# Patient Record
Sex: Male | Born: 1940 | Race: Black or African American | Hispanic: No | Marital: Single | Smoking: Never smoker
Health system: Southern US, Community
[De-identification: ages and names within clinical notes are randomized; demographics above are authoritative.]

## PROBLEM LIST (undated history)

## (undated) DIAGNOSIS — I6529 Occlusion and stenosis of unspecified carotid artery: Secondary | ICD-10-CM

## (undated) DIAGNOSIS — N6489 Other specified disorders of breast: Secondary | ICD-10-CM

## (undated) DIAGNOSIS — Z7289 Other problems related to lifestyle: Secondary | ICD-10-CM

## (undated) DIAGNOSIS — E291 Testicular hypofunction: Secondary | ICD-10-CM

## (undated) DIAGNOSIS — E7800 Pure hypercholesterolemia, unspecified: Secondary | ICD-10-CM

## (undated) DIAGNOSIS — I10 Essential (primary) hypertension: Secondary | ICD-10-CM

## (undated) DIAGNOSIS — N62 Hypertrophy of breast: Secondary | ICD-10-CM

## (undated) DIAGNOSIS — E119 Type 2 diabetes mellitus without complications: Secondary | ICD-10-CM

## (undated) HISTORY — DX: Other problems related to lifestyle: Z72.89

## (undated) HISTORY — DX: Pure hypercholesterolemia, unspecified: E78.00

## (undated) HISTORY — DX: Type 2 diabetes mellitus without complications: E11.9

## (undated) HISTORY — DX: Testicular hypofunction: E29.1

## (undated) HISTORY — DX: Essential (primary) hypertension: I10

## (undated) HISTORY — PX: PROSTATE SURGERY: SHX751

## (undated) HISTORY — DX: Occlusion and stenosis of unspecified carotid artery: I65.29

## (undated) HISTORY — DX: Hypertrophy of breast: N62

## (undated) HISTORY — DX: Other specified disorders of breast: N64.89

---

## 2006-05-18 ENCOUNTER — Emergency Department: Payer: Self-pay | Admitting: Unknown Physician Specialty

## 2006-05-18 ENCOUNTER — Other Ambulatory Visit: Payer: Self-pay

## 2007-03-09 DIAGNOSIS — E7800 Pure hypercholesterolemia, unspecified: Secondary | ICD-10-CM

## 2007-03-09 DIAGNOSIS — K219 Gastro-esophageal reflux disease without esophagitis: Secondary | ICD-10-CM | POA: Insufficient documentation

## 2007-03-09 DIAGNOSIS — I10 Essential (primary) hypertension: Secondary | ICD-10-CM

## 2007-03-09 HISTORY — DX: Essential (primary) hypertension: I10

## 2007-03-09 HISTORY — DX: Pure hypercholesterolemia, unspecified: E78.00

## 2007-11-30 DIAGNOSIS — Z7289 Other problems related to lifestyle: Secondary | ICD-10-CM

## 2007-11-30 DIAGNOSIS — Z789 Other specified health status: Secondary | ICD-10-CM

## 2007-11-30 HISTORY — DX: Other specified health status: Z78.9

## 2007-11-30 HISTORY — DX: Other problems related to lifestyle: Z72.89

## 2008-08-31 DIAGNOSIS — E119 Type 2 diabetes mellitus without complications: Secondary | ICD-10-CM

## 2008-08-31 HISTORY — DX: Type 2 diabetes mellitus without complications: E11.9

## 2008-09-01 ENCOUNTER — Ambulatory Visit: Payer: Self-pay | Admitting: Family Medicine

## 2008-09-15 DIAGNOSIS — I6529 Occlusion and stenosis of unspecified carotid artery: Secondary | ICD-10-CM

## 2008-09-15 HISTORY — DX: Occlusion and stenosis of unspecified carotid artery: I65.29

## 2008-10-03 ENCOUNTER — Ambulatory Visit: Payer: Self-pay | Admitting: Vascular Surgery

## 2008-12-28 ENCOUNTER — Emergency Department: Payer: Self-pay | Admitting: Emergency Medicine

## 2009-02-08 ENCOUNTER — Ambulatory Visit: Payer: Self-pay | Admitting: Family Medicine

## 2011-03-27 ENCOUNTER — Ambulatory Visit: Payer: Self-pay | Admitting: Family Medicine

## 2011-09-22 ENCOUNTER — Emergency Department: Payer: Self-pay | Admitting: Emergency Medicine

## 2011-09-22 LAB — BASIC METABOLIC PANEL
Anion Gap: 5 — ABNORMAL LOW (ref 7–16)
Calcium, Total: 9.2 mg/dL (ref 8.5–10.1)
Chloride: 107 mmol/L (ref 98–107)
Co2: 29 mmol/L (ref 21–32)
Creatinine: 1.41 mg/dL — ABNORMAL HIGH (ref 0.60–1.30)

## 2011-09-22 LAB — URIC ACID: Uric Acid: 6.8 mg/dL (ref 3.5–7.2)

## 2012-01-10 ENCOUNTER — Emergency Department: Payer: Self-pay | Admitting: Emergency Medicine

## 2012-01-13 ENCOUNTER — Emergency Department: Payer: Self-pay | Admitting: Emergency Medicine

## 2012-11-04 ENCOUNTER — Observation Stay: Payer: Self-pay | Admitting: Internal Medicine

## 2012-11-04 DIAGNOSIS — K922 Gastrointestinal hemorrhage, unspecified: Secondary | ICD-10-CM | POA: Insufficient documentation

## 2012-11-04 LAB — COMPREHENSIVE METABOLIC PANEL
Anion Gap: 4 — ABNORMAL LOW (ref 7–16)
BUN: 26 mg/dL — ABNORMAL HIGH (ref 7–18)
Calcium, Total: 9 mg/dL (ref 8.5–10.1)
Co2: 26 mmol/L (ref 21–32)
EGFR (Non-African Amer.): 46 — ABNORMAL LOW
Glucose: 111 mg/dL — ABNORMAL HIGH (ref 65–99)
Osmolality: 283 (ref 275–301)
Potassium: 4.2 mmol/L (ref 3.5–5.1)
SGOT(AST): 25 U/L (ref 15–37)
SGPT (ALT): 31 U/L (ref 12–78)
Sodium: 139 mmol/L (ref 136–145)
Total Protein: 6.7 g/dL (ref 6.4–8.2)

## 2012-11-04 LAB — CK TOTAL AND CKMB (NOT AT ARMC)
CK, Total: 139 U/L (ref 35–232)
CK, Total: 117 U/L (ref 35–232)
CK-MB: 1.9 ng/mL (ref 0.5–3.6)
CK-MB: 1.7 ng/mL (ref 0.5–3.6)

## 2012-11-04 LAB — URINALYSIS, COMPLETE
Bacteria: NONE SEEN
Blood: NEGATIVE
Glucose,UR: NEGATIVE mg/dL (ref 0–75)
Leukocyte Esterase: NEGATIVE
Nitrite: NEGATIVE
Ph: 7 (ref 4.5–8.0)
RBC,UR: <1 /HPF (ref 0–5)
Specific Gravity: 1.012 (ref 1.003–1.030)

## 2012-11-04 LAB — CBC
HGB: 12.3 g/dL — ABNORMAL LOW (ref 13.0–18.0)
MCH: 25.6 pg — ABNORMAL LOW (ref 26.0–34.0)
Platelet: 166 10*3/uL (ref 150–440)
RDW: 15.2 % — ABNORMAL HIGH (ref 11.5–14.5)
WBC: 5.4 10*3/uL (ref 3.8–10.6)

## 2012-11-04 LAB — TROPONIN I
Troponin-I: <0.02 ng/mL
Troponin-I: <0.02 ng/mL

## 2012-11-05 LAB — CBC WITH DIFFERENTIAL/PLATELET
Basophil #: 0.1 10*3/uL (ref 0.0–0.1)
Eosinophil #: 0.1 10*3/uL (ref 0.0–0.7)
Lymphocyte %: 20.6 %
MCH: 25.5 pg — ABNORMAL LOW (ref 26.0–34.0)
Neutrophil #: 5 10*3/uL (ref 1.4–6.5)
Neutrophil %: 69.7 %
RBC: 4.27 10*6/uL — ABNORMAL LOW (ref 4.40–5.90)
RDW: 15.5 % — ABNORMAL HIGH (ref 11.5–14.5)

## 2012-11-05 LAB — BASIC METABOLIC PANEL
Anion Gap: 3 — ABNORMAL LOW (ref 7–16)
BUN: 28 mg/dL — ABNORMAL HIGH (ref 7–18)
Calcium, Total: 9.1 mg/dL (ref 8.5–10.1)
Chloride: 111 mmol/L — ABNORMAL HIGH (ref 98–107)
Creatinine: 1.51 mg/dL — ABNORMAL HIGH (ref 0.60–1.30)
EGFR (African American): 52 — ABNORMAL LOW
EGFR (Non-African Amer.): 45 — ABNORMAL LOW
Potassium: 3.8 mmol/L (ref 3.5–5.1)

## 2012-11-05 LAB — MAGNESIUM: Magnesium: 2 mg/dL

## 2012-11-05 LAB — LIPID PANEL
Cholesterol: 150 mg/dL (ref 0–200)
HDL Cholesterol: 36 mg/dL — ABNORMAL LOW (ref 40–60)
Ldl Cholesterol, Calc: 85 mg/dL (ref 0–100)
Triglycerides: 145 mg/dL (ref 0–200)
VLDL Cholesterol, Calc: 29 mg/dL (ref 5–40)

## 2012-11-06 ENCOUNTER — Inpatient Hospital Stay: Payer: Self-pay | Admitting: Internal Medicine

## 2012-11-06 LAB — COMPREHENSIVE METABOLIC PANEL
Alkaline Phosphatase: 58 U/L (ref 50–136)
BUN: 33 mg/dL — ABNORMAL HIGH (ref 7–18)
Bilirubin,Total: 0.2 mg/dL (ref 0.2–1.0)
Calcium, Total: 8.9 mg/dL (ref 8.5–10.1)
Creatinine: 1.87 mg/dL — ABNORMAL HIGH (ref 0.60–1.30)
EGFR (African American): 40 — ABNORMAL LOW
EGFR (Non-African Amer.): 35 — ABNORMAL LOW
SGOT(AST): 21 U/L (ref 15–37)
Sodium: 142 mmol/L (ref 136–145)
Total Protein: 6.1 g/dL — ABNORMAL LOW (ref 6.4–8.2)

## 2012-11-06 LAB — URINALYSIS, COMPLETE
Leukocyte Esterase: NEGATIVE
Nitrite: NEGATIVE
RBC,UR: <1 /HPF (ref 0–5)
Specific Gravity: 1.013 (ref 1.003–1.030)
Squamous Epithelial: <1

## 2012-11-06 LAB — APTT: Activated PTT: <23 secs — ABNORMAL LOW (ref 23.6–35.9)

## 2012-11-06 LAB — CBC
HCT: 30 % — ABNORMAL LOW (ref 40.0–52.0)
HCT: 26.7 % — ABNORMAL LOW (ref 40.0–52.0)
HGB: 9.6 g/dL — ABNORMAL LOW (ref 13.0–18.0)
MCHC: 32.7 g/dL (ref 32.0–36.0)
MCV: 79 fL — ABNORMAL LOW (ref 80–100)
Platelet: 145 10*3/uL — ABNORMAL LOW (ref 150–440)
RBC: 3.79 10*6/uL — ABNORMAL LOW (ref 4.40–5.90)
RBC: 3.37 10*6/uL — ABNORMAL LOW (ref 4.40–5.90)
RDW: 15 % — ABNORMAL HIGH (ref 11.5–14.5)
RDW: 15.1 % — ABNORMAL HIGH (ref 11.5–14.5)
WBC: 8.4 10*3/uL (ref 3.8–10.6)

## 2012-11-07 LAB — BASIC METABOLIC PANEL
Anion Gap: 2 — ABNORMAL LOW (ref 7–16)
Calcium, Total: 8.3 mg/dL — ABNORMAL LOW (ref 8.5–10.1)
Co2: 26 mmol/L (ref 21–32)
EGFR (African American): 45 — ABNORMAL LOW
Glucose: 108 mg/dL — ABNORMAL HIGH (ref 65–99)
Osmolality: 287 (ref 275–301)

## 2012-11-07 LAB — CBC WITH DIFFERENTIAL/PLATELET
Basophil %: 0.6 %
Eosinophil %: 1.4 %
HCT: 23 % — ABNORMAL LOW (ref 40.0–52.0)
Lymphocyte #: 1.4 10*3/uL (ref 1.0–3.6)
MCHC: 33.3 g/dL (ref 32.0–36.0)
Monocyte #: 0.6 x10 3/mm (ref 0.2–1.0)
Neutrophil #: 5.2 10*3/uL (ref 1.4–6.5)
Platelet: 147 10*3/uL — ABNORMAL LOW (ref 150–440)
RDW: 15 % — ABNORMAL HIGH (ref 11.5–14.5)

## 2012-11-07 LAB — HEMOGLOBIN: HGB: 7.8 g/dL — ABNORMAL LOW (ref 13.0–18.0)

## 2012-11-08 LAB — CBC WITH DIFFERENTIAL/PLATELET
Basophil #: 0 10*3/uL (ref 0.0–0.1)
Basophil %: 0.6 %
Eosinophil #: 0.2 10*3/uL (ref 0.0–0.7)
Eosinophil %: 2.5 %
HGB: 8.6 g/dL — ABNORMAL LOW (ref 13.0–18.0)
Lymphocyte #: 1.1 10*3/uL (ref 1.0–3.6)
Monocyte #: 0.4 x10 3/mm (ref 0.2–1.0)
Monocyte %: 6.5 %
RBC: 3.17 10*6/uL — ABNORMAL LOW (ref 4.40–5.90)
RDW: 14.9 % — ABNORMAL HIGH (ref 11.5–14.5)
WBC: 6.7 10*3/uL (ref 3.8–10.6)

## 2012-11-08 LAB — BASIC METABOLIC PANEL
Anion Gap: 3 — ABNORMAL LOW (ref 7–16)
BUN: 17 mg/dL (ref 7–18)
Calcium, Total: 8.3 mg/dL — ABNORMAL LOW (ref 8.5–10.1)
Creatinine: 1.46 mg/dL — ABNORMAL HIGH (ref 0.60–1.30)
EGFR (African American): 54 — ABNORMAL LOW
EGFR (Non-African Amer.): 47 — ABNORMAL LOW
Glucose: 98 mg/dL (ref 65–99)
Osmolality: 281 (ref 275–301)
Sodium: 140 mmol/L (ref 136–145)

## 2012-11-09 LAB — HEMOGLOBIN: HGB: 8.8 g/dL — ABNORMAL LOW (ref 13.0–18.0)

## 2012-11-10 LAB — HEMOGLOBIN: HGB: 9 g/dL — ABNORMAL LOW (ref 13.0–18.0)

## 2012-11-10 LAB — CREATININE, SERUM: Creatinine: 1.44 mg/dL — ABNORMAL HIGH (ref 0.60–1.30)

## 2013-04-20 ENCOUNTER — Other Ambulatory Visit: Payer: Self-pay | Admitting: Family Medicine

## 2013-04-20 LAB — BASIC METABOLIC PANEL
BUN: 12 mg/dL (ref 4–21)
Creatinine: 1.3 mg/dL (ref <=1.3)
Glucose: 99 mg/dL
Potassium: 3.9 mmol/L (ref 3.4–5.3)
SODIUM: 139 mmol/L (ref 137–147)

## 2013-04-20 LAB — LIPID PANEL
Cholesterol: 164 mg/dL (ref 0–200)
HDL: 39 mg/dL (ref 35–70)
LDL Cholesterol: 107 mg/dL
Triglycerides: 90 mg/dL (ref 40–160)

## 2013-04-21 ENCOUNTER — Other Ambulatory Visit: Payer: Self-pay | Admitting: Family Medicine

## 2013-04-21 LAB — LIPID PANEL
CHOLESTEROL: 164 mg/dL (ref 0–200)
HDL Cholesterol: 39 mg/dL — ABNORMAL LOW (ref 40–60)
Ldl Cholesterol, Calc: 107 mg/dL — ABNORMAL HIGH (ref 0–100)
Triglycerides: 90 mg/dL (ref 0–200)
VLDL CHOLESTEROL, CALC: 18 mg/dL (ref 5–40)

## 2013-04-21 LAB — RENAL FUNCTION PANEL
ALBUMIN: 3.4 g/dL (ref 3.4–5.0)
ANION GAP: 4 — AB (ref 7–16)
BUN: 12 mg/dL (ref 7–18)
CHLORIDE: 106 mmol/L (ref 98–107)
CREATININE: 1.3 mg/dL (ref 0.60–1.30)
Calcium, Total: 8.8 mg/dL (ref 8.5–10.1)
Co2: 29 mmol/L (ref 21–32)
EGFR (African American): >60
EGFR (Non-African Amer.): 53 — ABNORMAL LOW
GLUCOSE: 99 mg/dL (ref 65–99)
Osmolality: 277 (ref 275–301)
POTASSIUM: 3.9 mmol/L (ref 3.5–5.1)
Phosphorus: 3.2 mg/dL (ref 2.5–4.9)
SODIUM: 139 mmol/L (ref 136–145)

## 2013-04-21 LAB — HEMOGLOBIN A1C: Hemoglobin A1C: 6.7 % — ABNORMAL HIGH (ref 4.2–6.3)

## 2013-09-11 IMAGING — US US RENAL KIDNEY
1 series · 17 of 25 positions shown · non-contrast
Comparison: None

REASON FOR EXAM: blood in urine and renal insufficiency
COMMENTS:

PROCEDURE:     US  - US KIDNEY  - March 27, 2011  [DATE]
RESULT:     Indication: Blood in urine

[Series 1: us renal kidney · 17 of 47 slices shown]
[im 1/47]
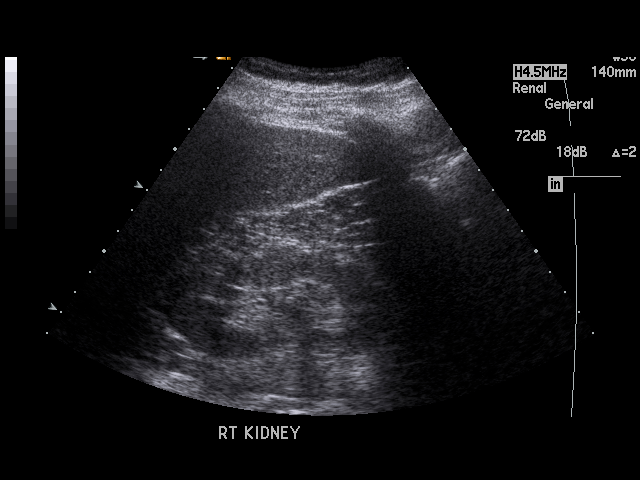
[im 4/47]
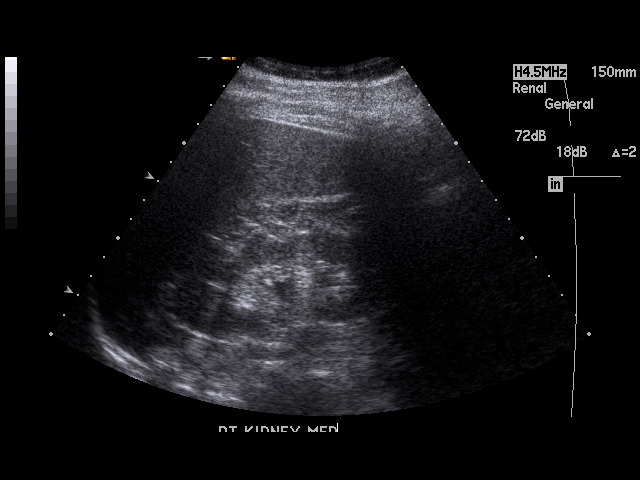
[im 6/47]
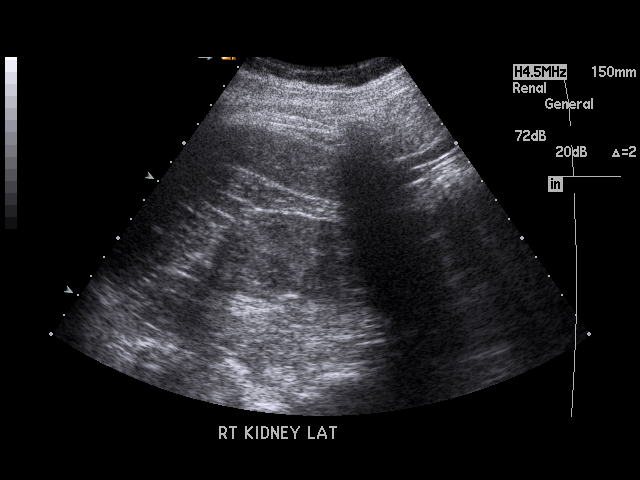
[im 10/47]
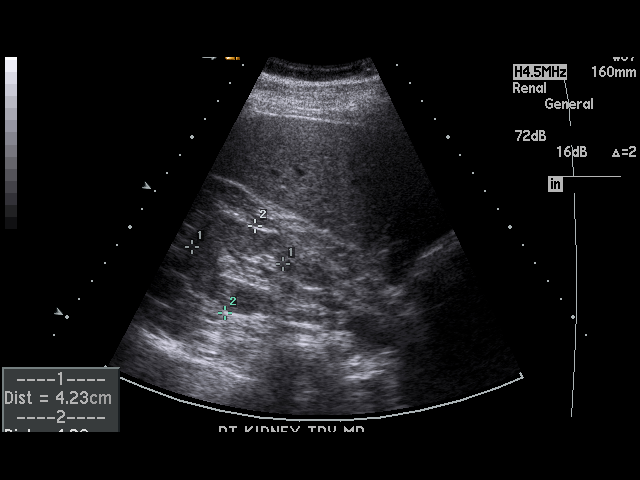
[im 12/47]
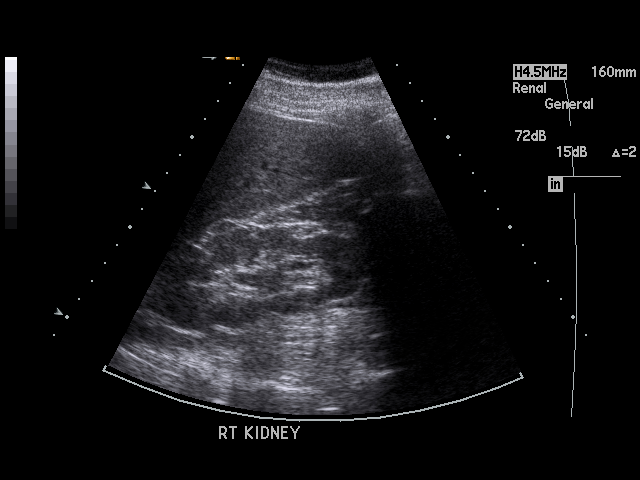
[im 16/47]
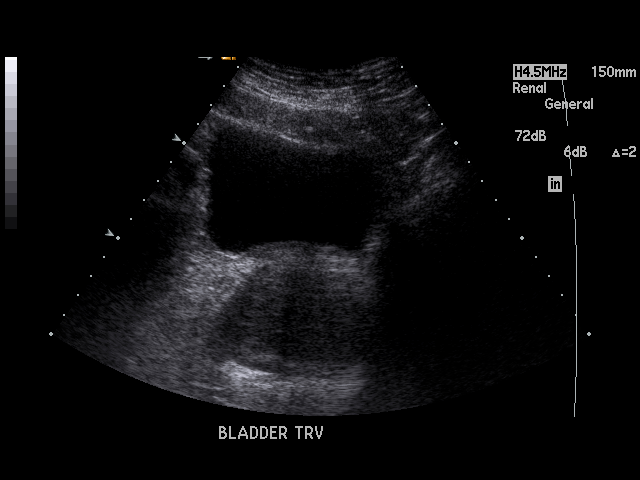
[im 18/47]
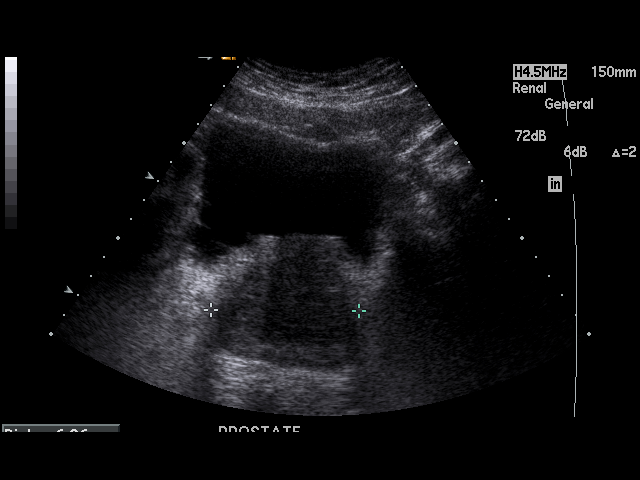
[im 22/47]
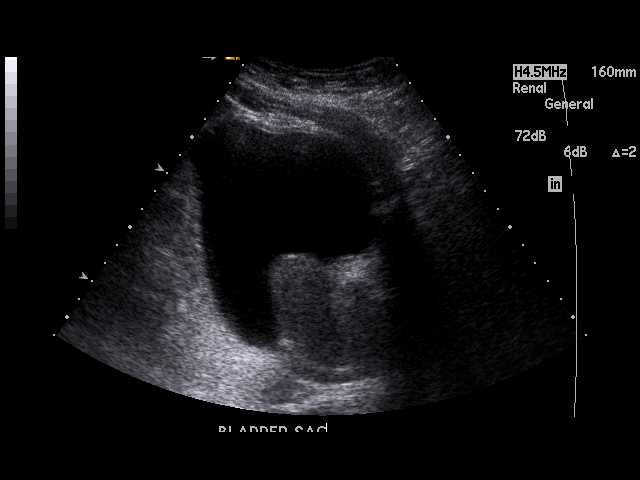
[im 24/47]
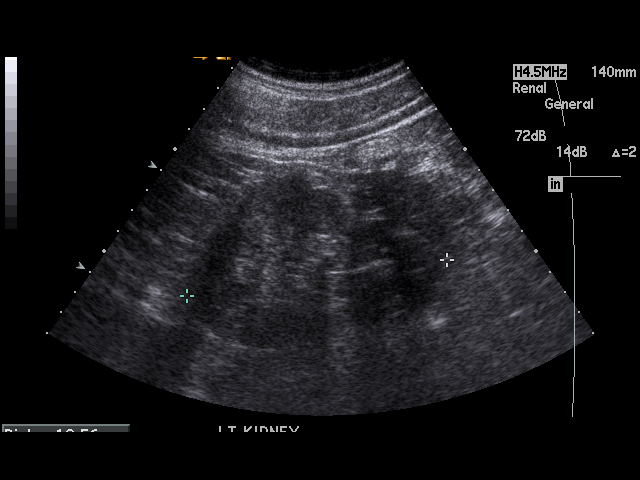
[im 25/47]
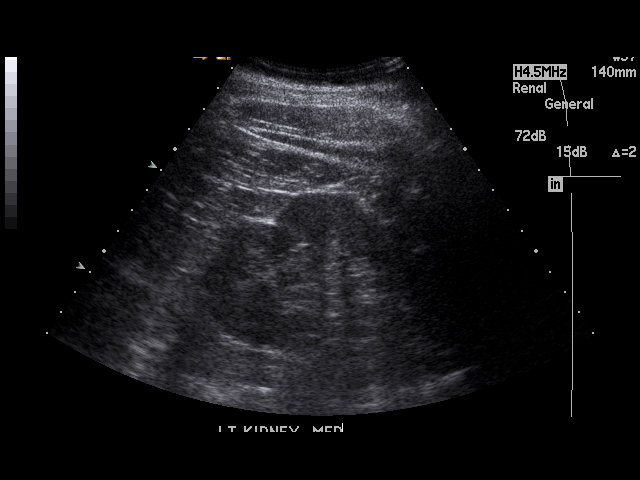
[im 29/47]
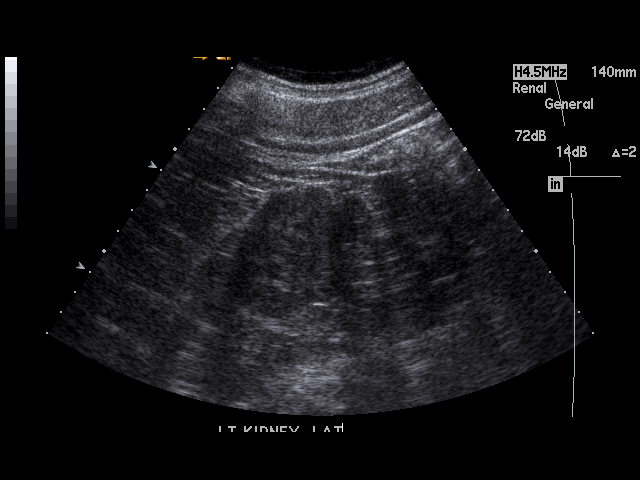
[im 31/47]
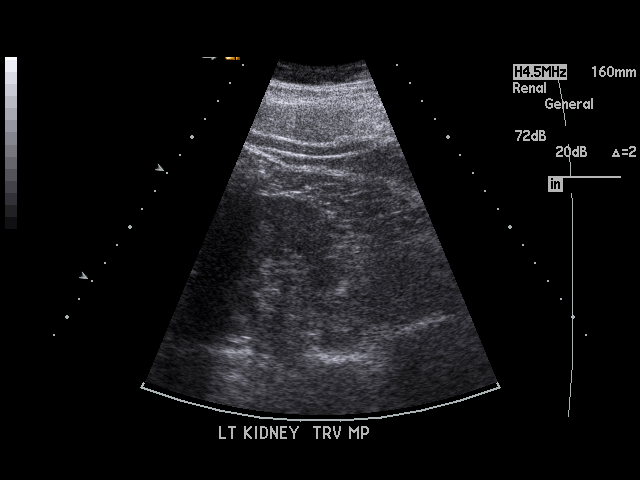
[im 35/47]
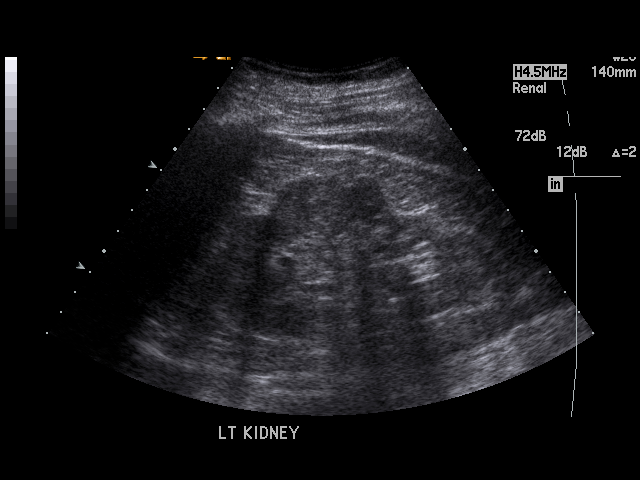
[im 37/47]
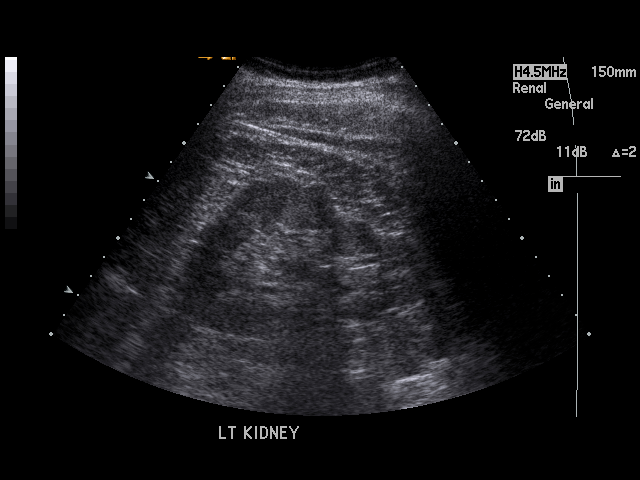
[im 41/47]
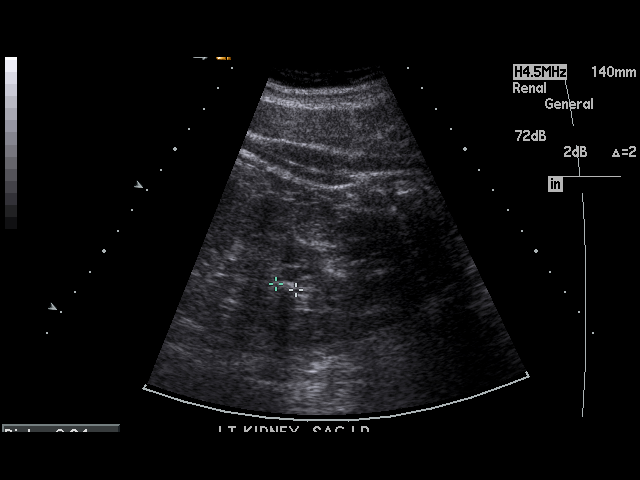
[im 43/47]
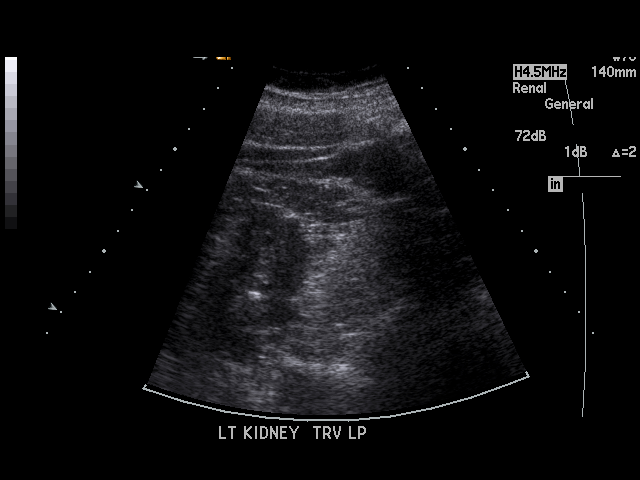
[im 47/47]
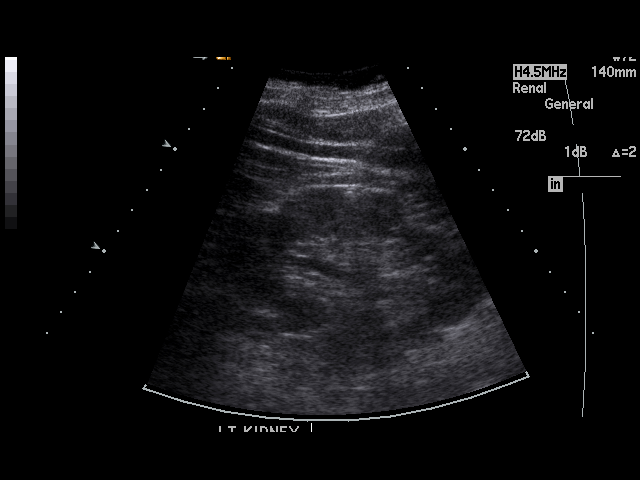

[17 of 25 positions shown; findings below may reference images not displayed]

Technique and findings: Multiple gray-scale and color doppler images of the
kidneys were obtained.

The right kidney measures 10.1 x 4.3 x 4.2 cm and the left kidney measures
10.6 x 5.2 x 5.2 cm. There is a nonobstructing left renal calculus. There is
no hydronephrosis.  There are no echogenic foci.  There are no renal masses.
There is no free fluid in the region of the renal fossa.
IMPRESSION: 1. Small nonobstructing left renal calculus.

2. Otherwise normal renal ultrasound.

## 2014-03-24 DIAGNOSIS — I6529 Occlusion and stenosis of unspecified carotid artery: Secondary | ICD-10-CM | POA: Diagnosis not present

## 2014-03-24 DIAGNOSIS — E1129 Type 2 diabetes mellitus with other diabetic kidney complication: Secondary | ICD-10-CM | POA: Diagnosis not present

## 2014-03-24 DIAGNOSIS — N181 Chronic kidney disease, stage 1: Secondary | ICD-10-CM | POA: Diagnosis not present

## 2014-03-24 LAB — HEMOGLOBIN A1C: HEMOGLOBIN A1C: 6.3 % — AB (ref 4.0–6.0)

## 2014-04-21 DIAGNOSIS — H4011X3 Primary open-angle glaucoma, severe stage: Secondary | ICD-10-CM | POA: Diagnosis not present

## 2014-04-28 NOTE — Consult Note (Signed)
I discussed transfusion with patient yesterday, will call daughter about this also.  Electronic Signatures: Scot JunElliott, Jovonni T (MD)  (Signed on 02-Nov-14 09:50)  Authored  Last Updated: 02-Nov-14 09:50 by Scot JunElliott, Terrion T (MD)

## 2014-04-28 NOTE — Consult Note (Signed)
PATIENT NAME:  Ernest Lewis, Berry C MR#:  [id number] DATE OF BIRTH:  1940/12/18  DATE OF CONSULTATION:  11/06/2012  REFERRING PHYSICIAN:   CONSULTING PHYSICIAN:  Scot Junobert T. Elliott, MD  HISTORY OF PRESENT ILLNESS:  The patient is a 74 year old black male who was originally admitted to this facility with presyncope, possible dehydration, vasovagal syncope, was discharged yesterday and he had another syncopal spell today and was found on the bathroom floor and was brought to the hospital where he was noted to have worsening anemia and heme-positive melanotic stools.  I was asked to see him in consultation after he was admitted.   PAST MEDICAL HISTORY:  Type 2 diabetes, BPH, mild chronic renal insufficiency, benign hypertension.   MEDICATIONS:  Flomax 0.4 mg daily, Norvasc 10 mg a day, pravastatin 10 mg a day, aspirin 81 mg a day.   ALLERGIES:  No known drug allergies.   SOCIAL HISTORY:  Does not smoke, does not drink.  Daughter is present for the history.   FAMILY HISTORY:  Hypertension.   REVIEW OF SYSTEMS:   No blurred vision.  No ringing in his ears.  No asthma or wheezing.  No chest pains.  No skipping irregular heartbeats.  He has noticed that his stools have become darker over the last week.  He did take Pepto-Bismol 2 times.  No dysuria.  No hematuria.  No history of stroke or seizure.   PHYSICAL EXAMINATION:  VITAL SIGNS:  The patient was attempted to do postural blood pressures in the ER and got lightheaded and weak with standing up.  HEENT:  Sclerae are nonicteric.  Conjunctivae negative.  Tongue negative.  NECK:  No thyromegaly.  No carotid bruits.  CHEST:  Clear.  HEART:  Regular rate and rhythm.  ABDOMEN:  No hepatosplenomegaly.  No masses.  No bruits.  RECTAL:  Exam done by ER physician was black liquid stool, which was heme positive.  EXTREMITIES:  No edema. NEUROLOGIC:  The patient pleasant, appropriate and fairly good historian.    He had been bothered mostly by his prostate  in the past.    LABORATORY DATA:  EKG shows no acute changes, BUN 33, creatinine 1.7, sodium 142, potassium 4.1, white count 7.6, hemoglobin 9.6, yesterday was 10.9, PT today 13.5, INR 1.  A CT of the head, changes of atrophy with chronic microvascular ischemic disease, stable appearance.  Cervical spine, severe degenerative changes in the cervical spine, some spinal canal narrowing and foraminal narrowing diffusely.  Ultrasound of his carotids done a couple of days ago showed atherosclerotic disease without definite evidence of hemodynamically significant stenosis.   ASSESSMENT:  Gastrointestinal bleed, possible ulcer, possible gastritis.  I doubt neoplasm.   PLAN:  Hydrate today.  Repeat hemoglobin tomorrow and do an upper endoscopy tomorrow.    ____________________________ Scot Junobert T. Elliott, MD rte:ea D: 11/06/2012 16:46:50 ET T: 11/06/2012 17:36:59 ET JOB#: [id number]  cc: Scot Junobert T. Elliott, MD, <Dictator> Demetrios Isaacsonald E. Sherrie MustacheFisher, MD Scot JunOBERT T ELLIOTT MD ELECTRONICALLY SIGNED 12/07/2012 15:02

## 2014-04-28 NOTE — Consult Note (Signed)
Pt CC duodenal ulcer with bleeding. EGD with treatment of ulcer, transfused blood as appropriate.  Today no postural signif changes, feels better, a bit of abd pain.  Is hungry.  Hgb 8.6, plt 131, creat 1.46, bun17.  Start clear liquid and advance to full as tolerated but not to regular food for 4-5 days.  Home Wednesday if no rebleed.  follow up with me in office in 2 weeks or so.  Electronic Signatures: Scot JunElliott, Gabriel T (MD)  (Signed on 03-Nov-14 15:37)  Authored  Last Updated: 03-Nov-14 15:37 by Scot JunElliott, Leopoldo T (MD)

## 2014-04-28 NOTE — Discharge Summary (Signed)
PATIENT NAME:  Ernest Lewis, Ernest Lewis MR#:  [id number] DATE OF BIRTH:  1940-03-08  DATE OF ADMISSION:  11/04/2012 DATE OF DISCHARGE:  11/05/2012  DISCHARGE DIAGNOSES: 1.  Presyncope secondary to vasovagal and dehydration.  2.  Benign prostatic hypertrophy.   IMAGING STUDIES: Include:  1.  Carotid Dopplers which showed no significant stenosis.  2.  Echocardiogram showed EF of 60% with no significant valvular abnormalities. No thrombus.  3.  CT scan of the head showed no acute abnormalities.   ADMITTING HISTORY AND PHYSICAL: Please see detailed H and P dictated by Dr. Amado CoeGouru. In brief, a 74 year old African American male patient with history of prostate problems and hyperlipidemia who presented to the hospital with complaints of presyncope. The patient felt like he was dehydrated, was in very hot environment for a day with decreased intake. Also had problems with urination and was straining prior to the episode. The patient was admitted for further work-up.   HOSPITAL COURSE: The patient was started on IV fluids. Does have CKD with baseline creatinine of 1.4, which is stable. The patient did have positive orthostatics initially which resolved with IV fluids. The patient felt better by the day of discharge, had normal carotids on echo and CT scan of the head. Was started on Flomax for his straining. He likely had a vasovagal presyncopal episode secondary to straining which caused symptoms.   Prior to discharge, the patient felt well, ambulated on his own. Cardiac examination showed S1 and S2 without any murmurs.   DISCHARGE MEDICATIONS: Include:  1.  Flomax 0.4 mg oral daily.  2.  Amlodipine 10 mg daily.  3.  Pravastatin 10 mg daily.  4.  Aspirin 81 mg daily.   DISCHARGE INSTRUCTIONS: Low-salt diet. Activity as tolerated. Follow up with primary care physician in 1 to 2 weeks.   TIME SPENT: On day of discharge in discharge activity was greater than 30 minutes. ____________________________ Molinda BailiffSrikar R.  Marcena Dias, MD srs:sb D: 11/05/2012 15:44:06 ET T: 11/05/2012 16:32:35 ET JOB#: [id number]  cc: Wardell HeathSrikar R. Thaddius Manes, MD, <Dictator> Orie FishermanSRIKAR R Tateanna Bach MD ELECTRONICALLY SIGNED 11/15/2012 15:00

## 2014-04-28 NOTE — H&P (Signed)
PATIENT NAME:  Ernest RoupMORROW, Jaxten C MR#:  [id number] DATE OF BIRTH:  11/11/1940  DATE OF ADMISSION:  11/04/2012  PRIMARY CARE PHYSICIAN: Dr. Chilton SiGreen   CHIEF COMPLAINT: Near syncope.   HISTORY OF PRESENT ILLNESS: The patient is a 74 year old African American male with a past medical history of diabetes mellitus, hypertension and probably chronic renal insufficiency who is presenting to the ER with the chief complaint of dizziness associated with diaphoresis. The patient is reporting that at around 3 a.m. today he was feeling thirsty and went to the fridge to get a cup of water, felt dizzy, and almost passed out. The patient was in drenching sweats and had a bowel movement also. Denies any complete loss of consciousness. Denies any chest pain, shortness of breath, abdominal pain, nausea, vomiting, diarrhea. He admitted that as he has been constipated he is using stool softeners and also drinking a lot of prune juice regarding his constipation. He denies any jerking movements during that period of near syncope. He felt like he was dehydrated as he was getting too much heat from his heater. The patient was uncomfortable and drove to the ER. In the ER, CAT scan of the head is negative. First set of troponins are negative. EKG did not show any changes. The patient was slightly orthostatic. He is not taking any home medications for the past 1 year and not seen by any doctor for the past 1 year. The patient has history of diabetes mellitus, but he does not believe that he is diabetic. No other complaints. Resting comfortably during my examination. No family members at bedside.   PAST MEDICAL HISTORY: Diabetes mellitus, hypertension, probably chronic renal insufficiency.   PAST SURGICAL HISTORY: None.   ALLERGIES: No known drug allergies.   PSYCHOSOCIAL HISTORY: Lives at home with son. Denies any history of smoking. He used to drink but quit drinking. Denies any drugs.   FAMILY HISTORY: Dad has history of  hypertension. Mom was deceased when he was 89 years old.  REVIEW OF SYSTEMS: CONSTITUTIONAL: Denies any fever, fatigue or weakness.  EYES: Denies blurry vision, glaucoma.  ENT: Denies epistaxis, discharge.  RESPIRATORY: Denies cough, COPD. CARDIOVASCULAR: No chest pain, palpitations. Slightly dizzy.  GASTROINTESTINAL: Denies nausea, vomiting or diarrhea, but complaining of constipation and using stool softeners and prune juice.  GENITOURINARY: No dysuria or hematuria. Denies any hernias.  ENDOCRINE: Denies polyuria, nocturia. Denies any hypothyroidism.  HEMATOLOGIC AND LYMPHATIC: No anemia, easy bruising, bleeding.  INTEGUMENTARY: No acne, rash, lesions.  MUSCULOSKELETAL: No joint pain in the neck and back. Denies gout.  NEUROLOGIC: No vertigo or ataxia.  PSYCHIATRIC: Denies any ADD or OCD.  PHYSICAL EXAMINATION: VITAL SIGNS: Temperature is 98.4, pulse 75, respirations 18, blood pressure 153/80 and pulse ox 99% on room air.  GENERAL APPEARANCE: Not in acute distress. Moderately built and nourished.  HEENT: Normocephalic, atraumatic. Pupils are equally reacting to light and accommodation. No scleral icterus. No conjunctival injection. No sinus tenderness. No postnasal drip. Dry mucous membranes.  NECK: Supple. No JVD. No thyromegaly. Range of motion is intact.  LUNGS: Clear to auscultation bilaterally. No accessory muscle usage. Negative for tenderness on palpation.  CARDIAC: S1, S2 normal. Regular rate and rhythm. No murmurs.  ABDOMEN: Soft, obese. Bowel sounds are positive in all 4 quadrants. Nontender, nondistended. No hepatosplenomegaly. No masses. NEUROLOGIC: Awake, alert and oriented x 3. Cranial nerves II through XII are grossly intact. Motor and sensory is intact. Reflexes are 2+.  EXTREMITIES: No edema. No cyanosis. No clubbing.  VASCULAR: Dorsalis pedis and posterior tibialis are intact.  MUSCULOSKELETAL: No joint effusion, tenderness, erythema. PSYCH:  Normal mood and affect.   SKIN: Warm to touch. Normal turgor. No rashes. No lesions.  LABORATORY AND IMAGING STUDIES: A CT of the head is normal. Chest x-ray: No acute findings.   LFTs are normal. First set of cardiac enzymes are normal. WBC 5.4, hemoglobin 12.3, hematocrit 37.5, platelets 156. Urinalysis is normal. Glucose 111, BUN 26, creatinine 1.47 which was at 1.41 in September of 2013, sodium 139, potassium 4.2, chloride 109, CO2 26, anion gap 4, serum osmolality 283, GFR 54, calcium 9.0.   ASSESSMENT AND PLAN: A 74 year old African American male presenting to the ER with the chief complaint of near syncope. Will be admitted with the following assessment and plan:  1.  Near-syncope, probably from dehydration from too much heat from heater at home. Will admit him to med/surg unit with tele. Will obtain a 2-D echocardiogram and carotid Dopplers. CT head is negative. We will check orthostatics and neuro checks. We will provide him hydration with IV fluids. Cycle cardiac biomarkers.  2.  Acute kidney injury, probably on chronic kidney disease from dehydration. We will provide him gentle hydration with IV fluids.  3.  Diabetes mellitus. The patient will be on sliding scale insulin and will check his hemoglobin A1c. 4.  Hypertension. The patient stopped taking blood pressure medication. Will restart his amlodipine and also continue baby aspirin. We will check his fasting lipid panel.  5.  Noncompliance. Reinforced the importance of being compliant with the medication.  6.  Constipation. He is on stool softener and right now he denies any constipation. If necessary we will provide him stool softeners as needed.  7.  Will provide him gastrointestinal and deep vein thrombosis prophylaxis.   He is FULL CODE. Daughter is the medical power of attorney.   Diagnosis and plan of care was discussed in detail with the patient. He is aware of the plan. ____________________________ Ramonita Lab, MD ag:sb D: 11/04/2012 07:33:35  ET T: 11/04/2012 08:23:34 ET JOB#: [id number]  cc: Ramonita Lab, MD, <Dictator> Dr. Sherin Quarry MD ELECTRONICALLY SIGNED 11/11/2012 8:20

## 2014-04-28 NOTE — Consult Note (Signed)
Pt doing well, hgb stable, no bleeding, no abd pain.  No postural changes.  He can go home tomorrow on BID PPI and see me in office in 2-3 weeks.  Take one iron pill a day with food.  Avoid all NSAID meds.  Electronic Signatures: Scot JunElliott, Nezar T (MD)  (Signed on 04-Nov-14 18:12)  Authored  Last Updated: 04-Nov-14 18:12 by Scot JunElliott, Juniel T (MD)

## 2014-04-28 NOTE — Discharge Summary (Signed)
PATIENT NAME:  Ernest Lewis, Ernest Lewis MR#:  [id number] DATE OF BIRTH:  15-Oct-1940  DATE OF ADMISSION:  11/06/2012 DATE OF DISCHARGE:  11/10/2012  ADMITTING DIAGNOSIS: Upper gastrointestinal bleed.   DISCHARGE DIAGNOSES:  1. Acute posthemorrhagic anemia, status post 1 unit of packed red blood cell transfusion, status post esophagogastroduodenoscopy on 11/07/2012 by Dr. Mechele Collin. Mallory-Weiss tear, hiatal hernia as well as cratered duodenal ulcer were noted, status post epinephrine injection as well as thermal therapy.  2. Syncope, likely due to gastrointestinal bleed.  3. Renal insufficiency, chronic versus acute.  4. Generalized weakness.  5. History of benign prostatic hypertrophy, hyperlipidemia and glaucoma.   DISCHARGE CONDITION: Stable.   DISCHARGE MEDICATIONS: The patient is to continue:  1. Pravastatin 10 mg p.o. at bedtime.  2. Timolol ophthalmic solution 0.25% 1 drop to each eye twice daily.  3. Simbrinza 0.2%/1% ophthalmic suspension 1 drop to each eye 3 times daily.  4. Iron gluconate 240 mg twice daily. This is a new medication.  5. Omeprazole 40 mg twice daily. This is a new medication.  6. Dutasteride 0.5 mg once daily. 7. Tamsulosin 0.4 mg p.o. daily.  8. New dose of amlodipine 5 mg p.o. daily.  The patient is not to take aspirin unless recommended by Dr. Sherrie Mustache and not to take any other nonsteroidal anti-inflammatory medications, such as Advil, ibuprofen, Motrin or similar.   HOME OXYGEN: None.   DIET: 2 grams salt, low fat, low cholesterol, pureed, advance diet to regular consistency in the next 1 week.  ACTIVITY LIMITATIONS: As tolerated.   FOLLOWUP APPOINTMENTS: With Dr. Mechele Collin in 1 week after discharge, with Dr. Sherrie Mustache in 2 days after discharge.    CONSULTANTS: Scot Jun, MD  PROCEDURES: Upper gastrointestinal endoscopy, 11/07/2012, by Dr. Mechele Collin which showed Mallory-Weiss tear, hiatal hernia, 1 duodenal ulcer containing adherent clot, injected, 1 duodenal  ulcer containing adherent clot, treated with thermal therapy. Normal second part of the duodenum, according to gastroenterologist, Dr. Mechele Collin.   RADIOLOGIC STUDIES: Chest, PA and lateral, 11/06/2012, showed no acute cardiopulmonary disease, stable appearance. CT scan of cervical spine without contrast, 11/06/2012, revealed severe degenerative changes in the cervical spine, reversal of the normal cervical lordosis, areas of bony spurring causing some spinal canal narrowing and foraminal narrowing diffusely, according to radiologist. CT of head without contrast, 11/06/2012, showed changes of atrophy with chronic microvascular ischemic disease. Stable appearance otherwise.   HOSPITAL COURSE: The patient is a 74 year old African-American male with past medical history significant for history of BPH, history of admission to the facility with presyncope, which was felt to be due to dehydration as well as vasovagal issues, who presented back to the hospital with a syncopal episode. Apparently, he was found on the floor in the bathroom and was brought to the Emergency Room. He was found to have worsening anemia as well as melenic stools. He was complaining of generalized abdominal pain and dark bowel movements. Please refer to Dr. Judithann Sheen' admission note on 11/06/2012. On arrival to the Emergency Room, the patient's blood pressure was 118/61, pulse was 81, respiration rate was 16 and temperature was 97.7. Physical exam was unremarkable; however, rectal exam revealed black stool, per Emergency Room physician, which was guaiac-positive. The patient's lab data done on admission, 1st of November, revealed elevation of BUN and creatinine to 33 and 1.87; otherwise, BMP was unremarkable. The patient's albumin level was 3.1; otherwise, liver enzymes were normal. Troponin was normal. White blood cell count was normal at 7.6, hemoglobin was 9.6 and platelet  count 163. MCV was low at 79. The patient's coagulation panel was  unremarkable. Urinalysis was normal. EKG showed normal sinus rhythm at 96 beats per minute, normal axis. No acute ST-T changes and no significant change since prior EKG. The patient was admitted to the hospital for further evaluation. Because of his worsening anemia, he was transfused on 11/07/2012. He was noted to have hemoglobin level of 7.7 after rehydration, so he was transfused with 1 unit of packed red blood cells. Because of his anemia and guaiac-positive stools, consultation with gastroenterologist, Dr. Mechele CollinElliott, was obtained, who proceeded to upper GI endoscopy due to the patient's history of nonsteroidal anti-inflammatory medication use. Upper GI endoscopy revealed Mallory-Weiss tear as well as cratered duodenal ulcer with adherent clot which was found in the duodenal bulb. The lesion was 8 mm in largest dimension. Area was successfully injected was 8 mL of 1:10,000 solution of epinephrine for hemostasis. Coagulation for hemostasis using bipolar probe was successful. Second part of duodenum was normal. Post procedure, the patient was continued on clear liquid diet with good results. He was felt to be stable hemodynamically and ready to be discharged today, on 11/10/2012. His vitals were stable, with temperature of 98.4, pulse 60s to 70s, respiration rate was 18, blood pressure 115 to 160 systolic, and O2 saturations were 100% on room air at rest. The patient is to continue iron supplementation for his anemia. In regards to GI bleed, as mentioned above, it was due to duodenal bleed, and proton pump inhibitor was recommended. The patient is to continue proton pump inhibitor twice a day and follow up with Dr. Mechele CollinElliott for further recommendations.   In regards to syncope, it was felt to be due to GI bleed. No further workup was done except a CT scan of head as well as neck, which did not show any acute changes.   The patient was noted to have renal insufficiency. His creatinine level was high at 1.87 on day  of admission, 11/06/2012. With IV fluid administration as well as transfusion of packed red blood cells, the patient's creatinine level improved to 1.44. It is recommended to follow the patient's creatinine levels as outpatient and make decisions about evaluation of this patient for chronic renal insufficiency if his creatinine remains stable a little higher.   In regards to generalized weakness, physical therapist recommended home health physical therapy, which will be arranged for him as outpatient.   For BPH, hyperlipidemia and glaucoma, the patient is to continue his usual management. For history of diabetes mellitus, he is to continue low fat, low cholesterol, diabetic diet.   The patient is being discharged in stable condition with the above-mentioned medications and followup.   TIME SPENT: 40 minutes.  ____________________________ Katharina Caperima Rayshun Kandler, MD rv:lb D: 11/10/2012 13:43:53 ET T: 11/10/2012 14:21:31 ET JOB#: [id number]  cc: Katharina Caperima Glorianna Gott, MD, <Dictator> Demetrios Isaacsonald E. Sherrie MustacheFisher, MD Katharina CaperIMA Feleshia Zundel MD ELECTRONICALLY SIGNED 11/24/2012 19:07

## 2014-04-28 NOTE — H&P (Signed)
PATIENT NAME:  Ernest Lewis, Ernest Lewis MR#:  [id number] DATE OF BIRTH:  12-Sep-1940  DATE OF ADMISSION:  11/06/2012  REFERRING PHYSICIAN: Dorothea Glassman, MD  FAMILY PHYSICIAN: Demetrios Isaacs. Sherrie Mustache, MD  REASON FOR ADMISSION: Syncope.   HISTORY OF PRESENT ILLNESS: The patient is a 74 year old male who was recently admitted to this facility with presyncope, felt to be due to dehydration and vasovagal syncope. He was discharged yesterday. He was found on the floor of his bathroom today by his son after an apparent syncopal episode and was brought to the Emergency Room, where he was found to have worsening anemia with melanotic stools. The patient has had some generalized abdominal pain with dark bowel movements. He is now admitted for further evaluation.   PAST MEDICAL HISTORY:  1. Type 2 diabetes.  2. Benign hypertension.  3. Mild chronic renal insufficiency.  4. BPH.  MEDICATIONS:  1. Flomax 0.4 mg p.o. daily.  2. Norvasc 10 mg p.o. daily.  3. Pravastatin 10 mg p.o. daily.  4. Aspirin 81 mg p.o. daily.   ALLERGIES: No known drug allergies.   SOCIAL HISTORY: Has a remote history of alcohol abuse, but none recently. No tobacco abuse. Lives with his son.   FAMILY HISTORY: Positive for hypertension.   REVIEW OF SYSTEMS:  CONSTITUTIONAL: No fever or change in weight.  EYES: No blurred or double vision. No glaucoma.  ENT: No tinnitus or hearing loss. No nasal discharge or bleeding. No difficulty swallowing.  RESPIRATORY: No cough or wheezing. Denies hemoptysis.  CARDIOVASCULAR: No chest pain or orthopnea. No palpitations.  GASTROINTESTINAL: No nausea, vomiting or diarrhea. Has had abdominal pain. Bowels have been darker than usual in color.  GENITOURINARY: No dysuria or hematuria. No incontinence.  ENDOCRINE: No polyuria or polydipsia. No heat or cold intolerance.  HEMATOLOGIC: The patient denies anemia, easy bruising or bleeding.  LYMPHATIC: No swollen glands.  MUSCULOSKELETAL: The patient denies  pain in his neck, back, shoulders, knees or hips. No gout.  NEUROLOGIC: No numbness or migraines. Denies stroke or seizures.  PSYCHIATRIC: The patient denies anxiety, insomnia or depression.   PHYSICAL EXAMINATION:  GENERAL: The patient is in no acute distress.  VITAL SIGNS: Currently remarkable for a blood pressure of 118/61 with a heart rate of 81 and a respiratory rate of 16, temperature of 97.7.  HEENT: Normocephalic, atraumatic. Pupils equal, round and reactive to light and accommodation. Extraocular movements are intact. Sclerae are not icteric. Conjunctivae are clear. Oropharynx is clear.  NECK: Supple without JVD or bruits. No adenopathy or thyromegaly is noted.  LUNGS: Clear to auscultation and percussion without wheezes, rales or rhonchi. No dullness. Respiratory effort is normal.  CARDIAC: Reveals a regular rate and rhythm with a normal S1 and S2. No significant rubs, murmurs or gallops. PMI is nondisplaced. Chest wall is nontender.  ABDOMEN: Soft, nontender, with normoactive bowel sounds. No organomegaly or masses were appreciated. No hernias or bruits were noted.  RECTAL: Revealed black liquid stool per the Emergency Room physician which was guaiac-positive.  EXTREMITIES: Without clubbing, cyanosis or edema. Pulses were 2+ bilaterally.  SKIN: Warm and dry without rash or lesions.  NEUROLOGIC: Cranial nerves II through XII grossly intact. Deep tendon reflexes were symmetric. Motor and sensory exam is nonfocal.  PSYCHIATRIC: Revealed a patient who was alert and oriented to person, place and time. He was cooperative and used good judgment.   LABORATORY DATA: EKG revealed sinus rhythm with no acute ischemic changes. Troponin was less than 0.02. Glucose 164 with a  BUN of 33, creatinine of 1.7, sodium of 142 and a potassium of 4.1. GFR was 40. White count was 7.6 with a hemoglobin of 9.6. His hemoglobin yesterday at discharge was 10.9. Pro time today was 13.5 with an INR of 1.0.   IMAGING:  Chest x-ray revealed no acute disease. Head CT was unremarkable.   ASSESSMENT:  1. Acute gastrointestinal bleed, presumably upper.  2. Worsening anemia.  3. Syncope, presumably vasovagal.  4. Type 2 diabetes.  5. Chronic renal insufficiency.   PLAN: The patient will be admitted to the floor with IV fluids on a Protonix drip. Clear liquid diet. Will guaiac all stools. Will follow his hemoglobin closely. Will continue his pravastatin and Flomax. Will consult GI for possible endoscopy. Follow up routine labs in the morning. Will follow his sugars with Accu-Cheks q.a.c. and at bedtime and add sliding scale insulin as needed. Further treatment and evaluation will depend upon the patient's progress.   TOTAL TIME SPENT ON THIS PATIENT: 50 minutes.   ____________________________ Duane LopeJeffrey D. Judithann SheenSparks, MD jds:lb D: 11/06/2012 13:09:16 ET T: 11/06/2012 13:49:59 ET JOB#: [id number]  cc: Duane LopeJeffrey D. Judithann SheenSparks, MD, <Dictator> Demetrios Isaacsonald E. Sherrie MustacheFisher, MD Marguarite ArbourJEFFREY D Rease Wence MD ELECTRONICALLY SIGNED 11/07/2012 10:52

## 2014-04-28 NOTE — Consult Note (Signed)
Pt CC is GI bleeding from duodenal ulcer.  Pt hgb down to 7.7 and he has had 2 syncopal spells over last 2 days and had duodenal ulcer with bleeding I treated endoscopic therapy.   Will transfuse a unit of blood, gave him a liter of LR this morning before procedure.  Transfuse a unit and repeat hgb.     Electronic Signatures: Scot JunElliott, Jamonta T (MD)  (Signed on 02-Nov-14 09:45)  Authored  Last Updated: 02-Nov-14 09:45 by Scot JunElliott, Diana T (MD)

## 2014-05-04 DIAGNOSIS — H4011X3 Primary open-angle glaucoma, severe stage: Secondary | ICD-10-CM | POA: Diagnosis not present

## 2014-06-14 ENCOUNTER — Telehealth: Payer: Self-pay | Admitting: Family Medicine

## 2014-06-14 ENCOUNTER — Other Ambulatory Visit: Payer: Self-pay | Admitting: Family Medicine

## 2014-06-14 DIAGNOSIS — I10 Essential (primary) hypertension: Secondary | ICD-10-CM

## 2014-06-14 MED ORDER — AMLODIPINE BESYLATE 10 MG PO TABS: 10.0000 mg | ORAL_TABLET | Freq: Every day | ORAL | Status: DC

## 2014-06-14 NOTE — Telephone Encounter (Signed)
Pt's daughter Ernest Lewis called again and was upset that she hadn't heard anything back since she called the office today at 2pm. Ernest Lewis stated that pt hasn't had blood pressure medication in 3 days because the Lisinopril is making pt feel bad and fatigue. Ernest Lewis stated that Amplodopine needs to be called in today to [facility] [place]. While sending the message Marcelino DusterMichelle noticed that it looked like Dr. Sherrie MustacheFisher had already sent in the medication. I called Ernest Lewis back and suggested she contact [facility]. Thanks TNP

## 2014-06-14 NOTE — Telephone Encounter (Signed)
Patient notified

## 2014-06-14 NOTE — Telephone Encounter (Signed)
Have sent refill [facility]ipine to [facility] [place]. He needs to schedule office for BP follow up in 4 weeks.

## 2014-06-14 NOTE — Telephone Encounter (Signed)
Pt daughter, April called states pt was have side effects, having dizziness and seeing dots on the new Rx, Lisinopril.  Pt daughter states pt has stopped taking this and is fine now.  Pt is requesting a Rx sent in for Amlodipine 10mg .  [facility] [place].  [phone]#[phone]/[id number]

## 2014-06-15 NOTE — Telephone Encounter (Signed)
I called the Pharmacy and spoke with pharmacist who verified that this prescription refill lwas received and has been filled by the pt.

## 2014-06-20 ENCOUNTER — Ambulatory Visit: Payer: Self-pay | Admitting: Family Medicine

## 2014-07-07 DIAGNOSIS — E291 Testicular hypofunction: Secondary | ICD-10-CM | POA: Insufficient documentation

## 2014-07-07 DIAGNOSIS — N182 Chronic kidney disease, stage 2 (mild): Secondary | ICD-10-CM | POA: Insufficient documentation

## 2014-07-07 DIAGNOSIS — N62 Hypertrophy of breast: Secondary | ICD-10-CM | POA: Insufficient documentation

## 2014-07-07 DIAGNOSIS — R310 Gross hematuria: Secondary | ICD-10-CM | POA: Insufficient documentation

## 2014-07-07 DIAGNOSIS — R0989 Other specified symptoms and signs involving the circulatory and respiratory systems: Secondary | ICD-10-CM | POA: Insufficient documentation

## 2014-07-07 DIAGNOSIS — N1831 Chronic kidney disease, stage 3a: Secondary | ICD-10-CM | POA: Insufficient documentation

## 2014-07-13 ENCOUNTER — Other Ambulatory Visit: Payer: Self-pay | Admitting: Family Medicine

## 2014-07-14 ENCOUNTER — Encounter: Payer: Self-pay | Admitting: Family Medicine

## 2014-07-14 ENCOUNTER — Ambulatory Visit (INDEPENDENT_AMBULATORY_CARE_PROVIDER_SITE_OTHER): Payer: Medicare Other | Admitting: Family Medicine

## 2014-07-14 VITALS — BP 162/90 | HR 71 | Temp 98.6°F | Resp 16 | Wt 204.0 lb

## 2014-07-14 DIAGNOSIS — E1122 Type 2 diabetes mellitus with diabetic chronic kidney disease: Secondary | ICD-10-CM

## 2014-07-14 DIAGNOSIS — I6523 Occlusion and stenosis of bilateral carotid arteries: Secondary | ICD-10-CM

## 2014-07-14 DIAGNOSIS — N182 Chronic kidney disease, stage 2 (mild): Secondary | ICD-10-CM

## 2014-07-14 DIAGNOSIS — N189 Chronic kidney disease, unspecified: Secondary | ICD-10-CM | POA: Diagnosis not present

## 2014-07-14 DIAGNOSIS — E7800 Pure hypercholesterolemia, unspecified: Secondary | ICD-10-CM

## 2014-07-14 DIAGNOSIS — I10 Essential (primary) hypertension: Secondary | ICD-10-CM | POA: Diagnosis not present

## 2014-07-14 NOTE — Progress Notes (Signed)
Patient: Ernest RoupRobert C Ard Male    DOB: 1940/10/17   74 y.o.   MRN: [id number] Visit Date: 07/14/2014  Today's Provider: Mila Merryonald My Madariaga, MD   Chief Complaint  Patient presents with  . Follow-up  . Hypertension  . Diabetes  . Hyperlipidemia   Subjective:    Hypertension This is a chronic problem. The current episode started more than 1 year ago. The problem is unchanged. The problem is controlled. Associated symptoms include blurred vision. Pertinent negatives include no chest pain, headaches, malaise/fatigue, neck pain, palpitations or shortness of breath. Risk factors for coronary artery disease include diabetes mellitus.  Diabetes He presents for his follow-up diabetic visit. He has type 2 diabetes mellitus. His disease course has been stable. Pertinent negatives for hypoglycemia include no headaches. Associated symptoms include blurred vision. Pertinent negatives for diabetes include no chest pain and no fatigue.  Hyperlipidemia This is a chronic problem. The problem is controlled. Exacerbating diseases include diabetes. Pertinent negatives include no chest pain or shortness of breath.     Diabetes Mellitus Type II, Follow-up:   Lab Results  Component Value Date   HGBA1C 6.3* 03/24/2014   Last seen for diabetes 4 months ago.  Management changes included none. He reports good compliance with treatment. He is not having side effects. none Current symptoms include none and have been N/A. Current exercise: none  ------------------------------------------------------------------------   Hypertension, follow-up:  BP Readings from Last 3 Encounters:  07/14/14 116/72  03/24/14 190/100    He was last seen for hypertension 4 months ago.  BP at that visit was 190/100. Management changes since that visit include discontinued metoprolol due to bradycardia; changed to Lisinopril-HCTZ 10-12 mg qd, continue amlodipine .He reports good compliance with treatment. He is not  having side effects. none  He is not exercising. He is not adherent to low salt diet.   He is experiencing none.  Patient denies none.    -----------------------------------------------------------------------    Lipid/Cholesterol, Follow-up:   Last seen for this 4 months ago.  Management changes since that visit include noe.  Last Lipid Panel:    Component Value Date/Time   CHOL 164 04/20/2013   TRIG 90 04/20/2013   HDL 39 04/20/2013   LDLCALC 107 04/20/2013    He reports good compliance with treatment. He is not having side effects. none  Wt Readings from Last 3 Encounters:  07/14/14 204 lb (92.534 kg)  03/24/14 208 lb (94.348 kg)    ------------------------------------------------------------------------     Previous Medications   AMLODIPINE (NORVASC) 10 MG TABLET    Take by mouth.   FERROUS GLUCONATE (FERGON) 324 MG TABLET    Take by mouth.   LISINOPRIL-HYDROCHLOROTHIAZIDE (PRINZIDE,ZESTORETIC) 10-12.5 MG PER TABLET    Take 1 tablet by mouth daily.   MULTIPLE VITAMIN PO       OMEGA-3 FATTY ACIDS PO    Take by mouth.   OMEPRAZOLE (PRILOSEC) 40 MG CAPSULE    TAKE 1 CAPSULE BY MOUTH EVERY DAY   PRAVASTATIN (PRAVACHOL) 10 MG TABLET    Take by mouth.   TAMSULOSIN (FLOMAX) 0.4 MG CAPS CAPSULE    Take 1 capsule by mouth daily.    Review of Systems  Constitutional: Negative for fever, chills, malaise/fatigue and fatigue.  Eyes: Positive for blurred vision.  Respiratory: Negative for shortness of breath.   Cardiovascular: Negative for chest pain and palpitations.  Gastrointestinal: Negative for abdominal pain.  Musculoskeletal: Negative for neck pain.  Neurological: Negative for headaches.  History  Substance Use Topics  . Smoking status: Never Smoker   . Smokeless tobacco: Not on file  . Alcohol Use: Yes     Comment: Heavy drinking in 1990s on weekends   Objective:   BP 116/72 mmHg  Pulse 71  Temp(Src) 98.6 F (37 C) (Oral)  Resp 16  Wt 204 lb  (92.534 kg)  SpO2 96%  Physical Exam   General Appearance:    Alert, cooperative, no distress  Eyes:    PERRL, conjunctiva/corneas clear, EOM's intact       Lungs:     Clear to auscultation bilaterally, respirations unlabored  Heart:    Regular rate and rhythm  Neurologic:   Awake, alert, oriented x 3. No apparent focal neurological           defect.        Depression screen PHQ 2/9 07/14/2014  Decreased Interest 0  Down, Depressed, Hopeless 0  PHQ - 2 Score 0    Fall Risk  07/14/2014  Falls in the past year? No   Cognitive Testing - 6-CIT  Correct? Score   What year is it? yes 0 0 or 4  What month is it? yes 0 0 or 3  Memorize:    Floyde Parkins,  40,  High [address]  [place],      What time is it? (within 1 hour) yes 0 0 or 3  Count backwards from 20 yes 0 0, 2, or 4  Name the months of the year yes 4 0, 2, or 4  Repeat name & address above yes 8 0, 2, 4, 6, 8, or 10       TOTAL SCORE  12/28   Interpretation:  Abnormal-    Normal (0-7) Abnormal (8-28)       Assessment & Plan:     1. Carotid artery narrowing, bilateral Asymptomatic  2. Chronic kidney disease (CKD), stage II (mild)  - Renal function panel  3. Hypercholesteremia He is tolerating pravastatin well with no adverse effects.   - Lipid panel - Hepatic function panel  4. Essential hypertension Improved with change to lisinopril-hctz. Continue current medications.   - Renal function panel  5. Type 2 diabetes mellitus with diabetic chronic kidney disease  - Hemoglobin A1c   Follow up: No Follow-up on file.      Mila Merry, MD  [facility]

## 2014-08-04 ENCOUNTER — Encounter: Payer: Self-pay | Admitting: Family Medicine

## 2014-08-07 ENCOUNTER — Telehealth: Payer: Self-pay | Admitting: *Deleted

## 2014-08-07 NOTE — Telephone Encounter (Signed)
-----   Message from Malva Limes, MD sent at 08/04/2014  2:25 PM EDT ----- Regarding: Labs Please check with patient regarding labs ordered 07-14-14. We have still not received results.

## 2014-08-07 NOTE — Telephone Encounter (Signed)
LMOVM for pt to return call 

## 2014-08-07 NOTE — Telephone Encounter (Signed)
Pt returning call.  ZO#[phone]

## 2014-10-17 ENCOUNTER — Other Ambulatory Visit: Payer: Self-pay | Admitting: Family Medicine

## 2014-10-17 DIAGNOSIS — N400 Enlarged prostate without lower urinary tract symptoms: Secondary | ICD-10-CM

## 2014-10-17 NOTE — Telephone Encounter (Signed)
Last ov was on 07/14/2014. Last medication refill was on 04/212/2016 for X6.   Thanks,

## 2015-06-02 ENCOUNTER — Other Ambulatory Visit: Payer: Self-pay | Admitting: Family Medicine

## 2015-06-27 ENCOUNTER — Ambulatory Visit (INDEPENDENT_AMBULATORY_CARE_PROVIDER_SITE_OTHER): Payer: Medicare Other | Admitting: Family Medicine

## 2015-06-27 ENCOUNTER — Encounter: Payer: Self-pay | Admitting: Family Medicine

## 2015-06-27 VITALS — BP 140/78 | HR 63 | Temp 98.3°F | Resp 16 | Ht 68.0 in | Wt 206.0 lb

## 2015-06-27 DIAGNOSIS — I10 Essential (primary) hypertension: Secondary | ICD-10-CM | POA: Diagnosis not present

## 2015-06-27 DIAGNOSIS — N400 Enlarged prostate without lower urinary tract symptoms: Secondary | ICD-10-CM | POA: Diagnosis not present

## 2015-06-27 DIAGNOSIS — I6523 Occlusion and stenosis of bilateral carotid arteries: Secondary | ICD-10-CM

## 2015-06-27 DIAGNOSIS — N182 Chronic kidney disease, stage 2 (mild): Secondary | ICD-10-CM

## 2015-06-27 DIAGNOSIS — I779 Disorder of arteries and arterioles, unspecified: Secondary | ICD-10-CM | POA: Diagnosis not present

## 2015-06-27 DIAGNOSIS — I739 Peripheral vascular disease, unspecified: Secondary | ICD-10-CM

## 2015-06-27 DIAGNOSIS — E7800 Pure hypercholesterolemia, unspecified: Secondary | ICD-10-CM | POA: Diagnosis not present

## 2015-06-27 DIAGNOSIS — E1122 Type 2 diabetes mellitus with diabetic chronic kidney disease: Secondary | ICD-10-CM | POA: Diagnosis not present

## 2015-06-27 LAB — POCT GLYCOSYLATED HEMOGLOBIN (HGB A1C)
EGFR (Non-African Amer.): 143
Hemoglobin A1C: 6.6

## 2015-06-27 MED ORDER — TAMSULOSIN HCL 0.4 MG PO CAPS: 0.4000 mg | ORAL_CAPSULE | Freq: Every day | ORAL | Status: DC

## 2015-06-27 MED ORDER — AMLODIPINE BESYLATE 10 MG PO TABS: 10.0000 mg | ORAL_TABLET | Freq: Every day | ORAL | Status: DC

## 2015-06-27 MED ORDER — PRAVASTATIN SODIUM 20 MG PO TABS: 20.0000 mg | ORAL_TABLET | Freq: Every day | ORAL | Status: DC

## 2015-06-27 MED ORDER — LISINOPRIL-HYDROCHLOROTHIAZIDE 10-12.5 MG PO TABS: 1.0000 | ORAL_TABLET | Freq: Every day | ORAL | Status: DC

## 2015-06-27 NOTE — Progress Notes (Signed)
Patient: Ernest Lewis Male    DOB: 03-Nov-1940   75 y.o.   MRN: [id number] Visit Date: 06/27/2015  Today's Provider: Lelon Huh, MD   Chief Complaint  Patient presents with  . Follow-up  . Diabetes  . Hypertension  . Hyperlipidemia  . Chronic Kidney Disease   Subjective:    HPI   Chronic kidney disease (CKD), stage II (mild) Last seen 07/14/2014 and had labs ordered, but not done, apparently due to billing issues with [facility].     Diabetes Mellitus Type II, Follow-up:   Lab Results  Component Value Date   HGBA1C 6.3* 03/24/2014   HGBA1C 6.7* 04/21/2013   HGBA1C 5.3 11/05/2012   Last seen for diabetes 11 months ago.  Management since then includes; Labs ordered, zero done. He reports good compliance with treatment. He is not having side effects. none Current symptoms include none and have been not checking. Home blood sugar records: fasting range: not checking  Episodes of hypoglycemia? no   Current Insulin Regimen: n/a Most Recent Eye Exam: due Weight trend: stable Prior visit with dietician: no Current diet: well balanced Current exercise: walking  ----------------------------------------------------------------------   Hypertension, follow-up:  BP Readings from Last 3 Encounters:  06/27/15 140/78  07/14/14 162/90  03/24/14 190/100    He was last seen for hypertension 11 months ago.  BP at that visit was 116/72. Management since that visit includes; Labs ordered, zero done.He reports good compliance with treatment. He is not having side effects. none  He is exercising. He is adherent to low salt diet.   Outside blood pressures are normal. He is experiencing none.  Patient denies none.   Cardiovascular risk factors include diabetes mellitus.  Use of agents associated with hypertension: none.   ----------------------------------------------------------------------    Lipid/Cholesterol, Follow-up:   Last seen for this 11 months  ago.  Management since that visit includes;Labs ordered, zero done.   Last Lipid Panel:    Component Value Date/Time   CHOL 164 04/21/2013 1440   CHOL 164 04/20/2013   TRIG 90 04/21/2013 1440   TRIG 90 04/20/2013   HDL 39* 04/21/2013 1440   HDL 39 04/20/2013   VLDL 18 04/21/2013 1440   LDLCALC 107* 04/21/2013 1440   LDLCALC 107 04/20/2013    He reports good compliance with treatment. He is not having side effects. 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 Patient denies having any neurological symptoms including numbness or tingling of extremities, difficulty speaking, weakness or arms or legs. He states he does take 10m aspirin every day.     No Known Allergies Current Meds  Medication Sig  . amLODipine (NORVASC) 10 MG tablet Take by mouth.  .Marland Kitchenomeprazole (PRILOSEC) 40 MG capsule TAKE 1 CAPSULE BY MOUTH EVERY DAY  . tamsulosin (FLOMAX) 0.4 MG CAPS capsule TAKE 1 CAPSULE BY MOUTH EVERY NIGHT AT BEDTIME    Review of Systems  Constitutional: Negative for fever, chills and appetite change.  Respiratory: Negative for chest tightness, shortness of breath and wheezing.   Cardiovascular: Negative for chest pain and palpitations.  Gastrointestinal: Negative for nausea, vomiting and abdominal pain.    Social History  Substance Use Topics  . Smoking status: Never Smoker   . Smokeless  tobacco: Not on file  . Alcohol Use: Yes     Comment: Heavy drinking in 1990s on weekends   Objective:   BP 140/78 mmHg  Pulse 63  Temp(Src) 98.3 F (36.8 C) (Oral)  Resp 16  Ht _0  (1.727 m)  Wt 206 lb (93.441 kg)  BMI 31.33 kg/m2  SpO2 98%  Physical Exam   General Appearance:    Alert, cooperative, no distress  Eyes:     PERRL, conjunctiva/corneas clear, EOM's intact       Lungs:     Clear to auscultation bilaterally, respirations unlabored  Heart:    Regular rate and rhythm  Neurologic:   Awake, alert, oriented x 3. No apparent focal neurological           defect.       Results for orders placed or performed in visit on 06/27/15  POCT glycosylated hemoglobin (Hb A1C)  Result Value Ref Range   Hemoglobin A1C 6.6    EGFR (Non-African Amer.) 143         Assessment & Plan:     1. Type 2 diabetes mellitus with stage 2 chronic kidney disease, without long-term current use of insulin (HCC) Well controlled with diet. Follow up A1c about 4 months - POCT glycosylated hemoglobin (Hb A1C) - EKG 12-Lead  2. Carotid artery narrowing, bilateral asymptomatic  3. Chronic kidney disease (CKD), stage II (mild)   4. Hypercholesteremia Increase pravastatin to 8m  - Lipid panel - Hepatic function panel  5. Essential hypertension Well controlled.  Continue current medications.   - Renal function panel - lisinopril-hydrochlorothiazide (PRINZIDE,ZESTORETIC) 10-12.5 MG tablet; Take 1 tablet by mouth daily. Reported on 06/27/2015  Dispense: 90 tablet; Refill: 0  6. BPH (benign prostatic hyperplasia) He reports Flomax is working well and requests refills.  - tamsulosin (FLOMAX) 0.4 MG CAPS capsule; Take 1 capsule (0.4 mg total) by mouth at bedtime.  Dispense: 90 capsule; Refill: 0  7. Bilateral carotid artery disease (HCC)  - [place]otid Duplex Bilateral; Future - aspirin EC 81 MG tablet; Take 81 mg by mouth daily.        DLelon Huh MD  [facility]

## 2015-07-04 ENCOUNTER — Ambulatory Visit: Admission: RE | Admit: 2015-07-04 | Payer: Medicare Other | Source: Ambulatory Visit

## 2015-10-05 ENCOUNTER — Other Ambulatory Visit: Payer: Self-pay | Admitting: Family Medicine

## 2015-10-05 DIAGNOSIS — N400 Enlarged prostate without lower urinary tract symptoms: Secondary | ICD-10-CM

## 2015-10-05 NOTE — Telephone Encounter (Signed)
LOV 06/27/2015. Ernest Lewis, CMA  

## 2016-02-01 ENCOUNTER — Emergency Department
Admission: EM | Admit: 2016-02-01 | Discharge: 2016-02-01 | Disposition: A | Discharge status: Home / Self Care | Payer: Medicare HMO | Attending: Emergency Medicine | Admitting: Emergency Medicine

## 2016-02-01 DIAGNOSIS — J069 Acute upper respiratory infection, unspecified: Secondary | ICD-10-CM | POA: Diagnosis not present

## 2016-02-01 DIAGNOSIS — Z7982 Long term (current) use of aspirin: Secondary | ICD-10-CM | POA: Diagnosis not present

## 2016-02-01 DIAGNOSIS — I129 Hypertensive chronic kidney disease with stage 1 through stage 4 chronic kidney disease, or unspecified chronic kidney disease: Secondary | ICD-10-CM | POA: Insufficient documentation

## 2016-02-01 DIAGNOSIS — E1122 Type 2 diabetes mellitus with diabetic chronic kidney disease: Secondary | ICD-10-CM | POA: Insufficient documentation

## 2016-02-01 DIAGNOSIS — R05 Cough: Secondary | ICD-10-CM | POA: Diagnosis not present

## 2016-02-01 DIAGNOSIS — N182 Chronic kidney disease, stage 2 (mild): Secondary | ICD-10-CM | POA: Insufficient documentation

## 2016-02-01 DIAGNOSIS — Z79899 Other long term (current) drug therapy: Secondary | ICD-10-CM | POA: Insufficient documentation

## 2016-02-01 MED ORDER — BENZONATATE 100 MG PO CAPS: 200.0000 mg | ORAL_CAPSULE | Freq: Three times a day (TID) | ORAL | 0 refills | Status: DC | PRN

## 2016-02-01 NOTE — ED Provider Notes (Signed)
[facility]-EMERGENCY DEPARTMENT Provider Note   CSN: [id number] Arrival date & time: 02/01/16  1523     History   Chief Complaint Chief Complaint  Patient presents with  . Cough  . Fever    HPI Ernest Lewis is a 76 y.o. male presents today for evaluation of cough, fever and chills. Patient states 3 days ago he developed a terrible dry cough that has improved over the last 2 days. Yesterday he did develop body aches and chills that has resolved today. He denies any fevers at home, today at triage 99. He's been taking Alka-Seltzer cold. He denies any productivity with this cough. No shortness of breath. He does have mild chest pain that is sharp only when he coughs. No abdominal pain nausea vomiting or diarrhea. He is tolerating by mouth well. He has a history of hypertension, has not taken his blood pressure medicine in 2 days. Denies any vision changes or headache.  HPI  Past Medical History:  Diagnosis Date  . 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 Breasts asymmetrical   . Carotid artery stenosis 09/15/2008   Carotid dopplers 2010: +50% stenosis bilaterally. s/p vascular consult: recommend carotid dopplers every six months. Repeat carotid dopplers 08/2010: 50-69% stenosis R.  . Diabetes mellitus without complication (HCC) 08/31/2008   with Renal Complications  . Gynecomastia   . Hypercholesteremia 03/09/2007  . Hypertension 03/09/2007  . Hypogonadism, male     Patient Active Problem List   Diagnosis Date Noted  . Gynecomastia 07/07/2014  . 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 Frank hematuria 07/07/2014  . 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 Bleeding gastrointestinal 11/04/2012  . Carotid artery narrowing 09/15/2008  . Diabetes (HCC) 08/31/2008  . 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 Elevated PSA 03/25/2007  . BPH (benign prostatic hyperplasia) 03/09/2007  . Erectile dysfunction 03/09/2007  . GERD (gastroesophageal reflux disease) 03/09/2007  . Hypercholesteremia  03/09/2007  . Hypertension 03/09/2007    Past Surgical History:  Procedure Laterality Date  . PROSTATE SURGERY     [facility] Medications    Prior to Admission medications   Medication Sig Start Date End Date Taking? Authorizing Provider  amLODipine (NORVASC) 10 MG tablet TAKE 1 TABLET(10 MG) BY MOUTH DAILY 10/05/15   Malva Limes, MD  aspirin EC 81 MG tablet Take 81 mg by mouth daily.    Historical Provider, MD  benzonatate (TESSALON PERLES) 100 MG capsule Take 2 capsules (200 mg total) by mouth 3 (three) times daily as needed for cough. 02/01/16 01/31/17  Evon Slack, PA-C  ferrous gluconate (FERGON) 324 MG tablet Take by mouth. Reported on 06/27/2015 01/17/13   Historical Provider, MD  lisinopril-hydrochlorothiazide (PRINZIDE,ZESTORETIC) 10-12.5 MG tablet Take 1 tablet by mouth daily. Reported on 06/27/2015 06/27/15   Malva Limes, MD  MULTIPLE VITAMIN PO Reported on 06/27/2015 08/30/08   Historical Provider, MD  OMEGA-3 FATTY ACIDS PO Take by mouth. Reported on 06/27/2015    Historical Provider, MD  omeprazole (PRILOSEC) 40 MG capsule TAKE ONE CAPSULE BY MOUTH EVERY DAY 10/05/15   Malva Limes, MD  pravastatin (PRAVACHOL) 20 MG tablet TAKE 1 TABLET BY MOUTH DAILY 10/05/15   Malva Limes, MD  tamsulosin (FLOMAX) 0.4 MG CAPS capsule TAKE 1 CAPSULE(0.4 MG) BY MOUTH AT BEDTIME 10/05/15   Malva Limes, MD    Family History Family History  Problem Relation Age of Onset  . Hypertension Father   . CVA Father   .  Alcohol abuse Brother   . Diabetes Brother     Social History Social History  Substance Use Topics  . Smoking status: Never Smoker  . Smokeless tobacco: Never Used  . Alcohol use Yes     Comment: Heavy drinking in 1990s on weekends     Allergies   Patient has no known allergies.   Review of Systems Review of Systems  Constitutional: Positive for chills (one day ago). Negative for activity change, appetite change and fever.  HENT: Negative for  congestion, ear pain, mouth sores, rhinorrhea, sinus pressure, sore throat and trouble swallowing.   Eyes: Negative for photophobia, pain and discharge.  Respiratory: Positive for cough (dry). Negative for chest tightness, shortness of breath and wheezing.   Cardiovascular: Negative for chest pain and leg swelling.  Gastrointestinal: Negative for abdominal distention, abdominal pain, diarrhea, nausea and vomiting.  Genitourinary: Negative for difficulty urinating and dysuria.  Musculoskeletal: Negative for arthralgias, back pain and gait problem.  Skin: Negative for color change and rash.  Neurological: Negative for dizziness and headaches.  Hematological: Negative for adenopathy.  Psychiatric/Behavioral: Negative for agitation and behavioral problems.     Physical Exam Updated Vital Signs BP (!) 186/92   Pulse 72   Temp 99 F (37.2 C) (Oral)   Resp 20   Ht 5\' 8"  (1.727 m)   Wt 95.3 kg   SpO2 99%   BMI 31.93 kg/m   Physical Exam  Constitutional: He appears well-developed and well-nourished.  HENT:  Head: Normocephalic and atraumatic.  Right Ear: External ear normal.  Left Ear: External ear normal.  Nose: Nose normal.  Mouth/Throat: Oropharynx is clear and moist. No oropharyngeal exudate.  Eyes: Conjunctivae are normal.  Neck: Normal range of motion. Neck supple.  Cardiovascular: Normal rate and regular rhythm.   No murmur heard. Pulmonary/Chest: Effort normal and breath sounds normal. No respiratory distress. He has no wheezes. He has no rales. He exhibits no tenderness.  Abdominal: Soft. He exhibits no mass. There is no tenderness. There is no rebound. No hernia.  Musculoskeletal: He exhibits no edema.  Lymphadenopathy:    He has no cervical adenopathy.  Neurological: He is alert.  Skin: Skin is warm and dry.  Psychiatric: He has a normal mood and affect.  Nursing note and vitals reviewed.    ED Treatments / Results  Labs (all labs ordered are listed, but only  abnormal results are displayed) Labs Reviewed - No data to display  EKG  EKG Interpretation None       Radiology No results found.  Procedures Procedures (including critical care time)  Medications Ordered in ED Medications - No data to display   Initial Impression / Assessment and Plan / ED Course  I have reviewed the triage vital signs and the nursing notes.  Pertinent labs & imaging results that were available during my care of the patient were reviewed by me and considered in my medical decision making (see chart for details).     76 year old male with 3 days of cough chills body aches and low-grade fever. Patient's cough has improved significantly, continues to be dry mild and nonproductive. Chills and body aches have resolved. He is running mild low-grade fever today of 99. We'll treat with Tylenol, Tessalon Perles. He will increase fluids and is educated on signs and symptoms return to the emergency department for.Patient advised to start his medication for hypertension, has missed the last 2 days. Blood pressure slightly elevated. No headaches or vision changes or cardiac  chest pain today.  Final Clinical Impressions(s) / ED Diagnoses   Final diagnoses:  Viral upper respiratory tract infection    New Prescriptions New Prescriptions   BENZONATATE (TESSALON PERLES) 100 MG CAPSULE    Take 2 capsules (200 mg total) by mouth 3 (three) times daily as needed for cough.     Evon Slackhomas C Caliph Borowiak, PA-C 02/01/16 1702    Evon Slackhomas C Breylin Dom, PA-C 02/01/16 1703    Emily FilbertJonathan E Williams, MD 02/01/16 [phone]

## 2016-02-01 NOTE — ED Triage Notes (Signed)
Pt arrives with reports of cough with chest congestion and fever for greater than one week  Pt reports pain in his neck and chest when he coughs

## 2016-02-01 NOTE — Discharge Instructions (Signed)
Please continue to drink lots of fluids. Continue to check temperature frequently. Tylenol as needed for body aches, fevers. Any fevers above 101 that are not coming down with Tylenol return to the ER. Any shortness of breath worsening cough, worsening symptoms urgent changes in her health return to the ER.

## 2016-10-25 ENCOUNTER — Other Ambulatory Visit: Payer: Self-pay | Admitting: Family Medicine

## 2016-10-25 DIAGNOSIS — N400 Enlarged prostate without lower urinary tract symptoms: Secondary | ICD-10-CM

## 2016-10-26 ENCOUNTER — Other Ambulatory Visit: Payer: Self-pay | Admitting: Family Medicine

## 2016-10-26 DIAGNOSIS — N400 Enlarged prostate without lower urinary tract symptoms: Secondary | ICD-10-CM

## 2016-10-30 ENCOUNTER — Other Ambulatory Visit: Payer: Self-pay | Admitting: Family Medicine

## 2016-10-30 DIAGNOSIS — I10 Essential (primary) hypertension: Secondary | ICD-10-CM

## 2016-10-30 DIAGNOSIS — N400 Enlarged prostate without lower urinary tract symptoms: Secondary | ICD-10-CM

## 2016-11-03 ENCOUNTER — Encounter: Payer: Self-pay | Admitting: Emergency Medicine

## 2016-11-03 ENCOUNTER — Emergency Department: Payer: Medicare HMO

## 2016-11-03 ENCOUNTER — Emergency Department
Admission: EM | Admit: 2016-11-03 | Discharge: 2016-11-03 | Disposition: A | Discharge status: Home / Self Care | Payer: Medicare HMO | Attending: Emergency Medicine | Admitting: Emergency Medicine

## 2016-11-03 DIAGNOSIS — R55 Syncope and collapse: Secondary | ICD-10-CM | POA: Insufficient documentation

## 2016-11-03 DIAGNOSIS — Z7902 Long term (current) use of antithrombotics/antiplatelets: Secondary | ICD-10-CM | POA: Insufficient documentation

## 2016-11-03 DIAGNOSIS — E860 Dehydration: Secondary | ICD-10-CM

## 2016-11-03 DIAGNOSIS — I2510 Atherosclerotic heart disease of native coronary artery without angina pectoris: Secondary | ICD-10-CM | POA: Insufficient documentation

## 2016-11-03 DIAGNOSIS — S199XXA Unspecified injury of neck, initial encounter: Secondary | ICD-10-CM | POA: Diagnosis not present

## 2016-11-03 DIAGNOSIS — N182 Chronic kidney disease, stage 2 (mild): Secondary | ICD-10-CM | POA: Diagnosis not present

## 2016-11-03 DIAGNOSIS — S0990XA Unspecified injury of head, initial encounter: Secondary | ICD-10-CM | POA: Diagnosis not present

## 2016-11-03 DIAGNOSIS — I129 Hypertensive chronic kidney disease with stage 1 through stage 4 chronic kidney disease, or unspecified chronic kidney disease: Secondary | ICD-10-CM | POA: Insufficient documentation

## 2016-11-03 DIAGNOSIS — Z7982 Long term (current) use of aspirin: Secondary | ICD-10-CM | POA: Diagnosis not present

## 2016-11-03 DIAGNOSIS — E1122 Type 2 diabetes mellitus with diabetic chronic kidney disease: Secondary | ICD-10-CM | POA: Diagnosis not present

## 2016-11-03 DIAGNOSIS — R42 Dizziness and giddiness: Secondary | ICD-10-CM | POA: Diagnosis not present

## 2016-11-03 LAB — CBC
HCT: 43.4 % (ref 40.0–52.0)
Hemoglobin: 14 g/dL (ref 13.0–18.0)
MCH: 25.5 pg — AB (ref 26.0–34.0)
MCHC: 32.3 g/dL (ref 32.0–36.0)
MCV: 79.1 fL — AB (ref 80.0–100.0)
PLATELETS: 151 10*3/uL (ref 150–440)
RBC: 5.48 MIL/uL (ref 4.40–5.90)
RDW: 14.8 % — AB (ref 11.5–14.5)
WBC: 5.3 10*3/uL (ref 3.8–10.6)

## 2016-11-03 LAB — URINALYSIS, COMPLETE (UACMP) WITH MICROSCOPIC
Bacteria, UA: NONE SEEN
Bilirubin Urine: NEGATIVE
Glucose, UA: NEGATIVE mg/dL
Hgb urine dipstick: NEGATIVE
Ketones, ur: NEGATIVE mg/dL
LEUKOCYTES UA: NEGATIVE
Nitrite: NEGATIVE
Protein, ur: NEGATIVE mg/dL
RBC / HPF: NONE SEEN RBC/hpf (ref 0–5)
SPECIFIC GRAVITY, URINE: 1.012 (ref 1.005–1.030)
SQUAMOUS EPITHELIAL / LPF: NONE SEEN
pH: 5 (ref 5.0–8.0)

## 2016-11-03 LAB — BASIC METABOLIC PANEL
Anion gap: 6 (ref 5–15)
Anion gap: 4 — ABNORMAL LOW (ref 5–15)
BUN: 20 mg/dL (ref 6–20)
BUN: 18 mg/dL (ref 6–20)
CHLORIDE: 108 mmol/L (ref 101–111)
CHLORIDE: 111 mmol/L (ref 101–111)
CO2: 24 mmol/L (ref 22–32)
CO2: 24 mmol/L (ref 22–32)
CREATININE: 1.7 mg/dL — AB (ref 0.61–1.24)
CREATININE: 1.46 mg/dL — AB (ref 0.61–1.24)
Calcium: 9.2 mg/dL (ref 8.9–10.3)
Calcium: 8.6 mg/dL — ABNORMAL LOW (ref 8.9–10.3)
GFR calc Af Amer: 43 mL/min — ABNORMAL LOW (ref >60)
GFR calc non Af Amer: 37 mL/min — ABNORMAL LOW (ref >60)
GFR calc non Af Amer: 44 mL/min — ABNORMAL LOW (ref >60)
GFR, EST AFRICAN AMERICAN: 51 mL/min — AB (ref >60)
Glucose, Bld: 124 mg/dL — ABNORMAL HIGH (ref 65–99)
Glucose, Bld: 96 mg/dL (ref 65–99)
POTASSIUM: 3.7 mmol/L (ref 3.5–5.1)
Potassium: 3.4 mmol/L — ABNORMAL LOW (ref 3.5–5.1)
SODIUM: 138 mmol/L (ref 135–145)
Sodium: 139 mmol/L (ref 135–145)

## 2016-11-03 LAB — DIFFERENTIAL
BASOS ABS: 0 10*3/uL (ref 0–0.1)
Basophils Relative: 1 %
Eosinophils Absolute: 0.2 10*3/uL (ref 0–0.7)
Eosinophils Relative: 3 %
LYMPHS ABS: 1.6 10*3/uL (ref 1.0–3.6)
LYMPHS PCT: 31 %
Monocytes Absolute: 0.4 10*3/uL (ref 0.2–1.0)
Monocytes Relative: 8 %
NEUTROS ABS: 3 10*3/uL (ref 1.4–6.5)
Neutrophils Relative %: 57 %

## 2016-11-03 LAB — HEPATIC FUNCTION PANEL
ALBUMIN: 3.9 g/dL (ref 3.5–5.0)
ALK PHOS: 62 U/L (ref 38–126)
ALT: 22 U/L (ref 17–63)
AST: 23 U/L (ref 15–41)
BILIRUBIN TOTAL: 0.6 mg/dL (ref 0.3–1.2)
Bilirubin, Direct: <0.1 mg/dL — ABNORMAL LOW (ref 0.1–0.5)
TOTAL PROTEIN: 7.1 g/dL (ref 6.5–8.1)

## 2016-11-03 LAB — TROPONIN I: Troponin I: <0.03 ng/mL (ref <0.03)

## 2016-11-03 MED ORDER — SODIUM CHLORIDE 0.9 % IV BOLUS (SEPSIS)
1000.0000 mL | Freq: Once | INTRAVENOUS | Status: AC
  Administered 2016-11-03: 1000 mL via INTRAVENOUS

## 2016-11-03 NOTE — ED Triage Notes (Signed)
Arrives via EMS from work for c/o dizziness and syncope.  While at work patient became dizzy at work.  Patient reports he was standing and began to feel dizzy.  Per EMS patient had a syncopal event and hit head on floor above left eye.  On EMS arrival, patient was AAOx4 adn diaphoretic.  Patient has history of HTN and DM, and had been out of BP meds for about one month, just restarted medications this weekend.

## 2016-11-03 NOTE — Discharge Instructions (Signed)
Please return to the ER if you get weak or lightheaded again.please follow-up with your doctor in next few days. Please continue to drink plenty of fluids.

## 2016-11-03 NOTE — ED Notes (Signed)
Patient transported to CT 

## 2016-11-03 NOTE — ED Notes (Signed)
AAOx3.  Skin warm and dry.  NAD 

## 2016-11-03 NOTE — ED Provider Notes (Signed)
[facility] Emergency Department Provider Note   ____________________________________________   First MD Initiated Contact with Patient 11/03/16 [phone]     (approximate)  I have reviewed the triage vital signs and the nursing notes.   HISTORY  Chief Complaint Loss of Consciousness  HPI WILKIE ZENON is a 76 y.o. male he reports he was at work and began to feel lightheaded and dizzy. He was not spinning and near as I can tell. He got very sweaty and passed out. He did hit his head. On arrival he feels normal. He has a history of hypertension diabetes was out of his medicines but has restarted them 3 or 4 days ago. He is feeling fine now. He has no chest pain shortness of breath headache further dizziness or any other complaints.   Past Medical History:  Diagnosis Date  . 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 Breasts asymmetrical   . Carotid artery stenosis 09/15/2008   Carotid dopplers 2010: +50% stenosis bilaterally. s/p vascular consult: recommend carotid dopplers every six months. Repeat carotid dopplers 08/2010: 50-69% stenosis R.  . Diabetes mellitus without complication (HCC) 08/31/2008   with Renal Complications  . Gynecomastia   . Hypercholesteremia 03/09/2007  . Hypertension 03/09/2007  . Hypogonadism, male     Patient Active Problem List   Diagnosis Date Noted  . Gynecomastia 07/07/2014  . 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 Frank hematuria 07/07/2014  . 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 Bleeding gastrointestinal 11/04/2012  . Carotid artery narrowing 09/15/2008  . Diabetes (HCC) 08/31/2008  . 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 Elevated PSA 03/25/2007  . BPH (benign prostatic hyperplasia) 03/09/2007  . Erectile dysfunction 03/09/2007  . GERD (gastroesophageal reflux disease) 03/09/2007  . Hypercholesteremia 03/09/2007  . Hypertension 03/09/2007    Past Surgical History:  Procedure Laterality Date  . PROSTATE SURGERY     Evelene Croon    Prior to Admission medications   Medication Sig Start Date End Date Taking? Authorizing Provider  amLODipine (NORVASC) 10 MG tablet TAKE 1 TABLET(10 MG) BY MOUTH DAILY 10/25/16  Yes Malva Limes, MD  aspirin EC 81 MG tablet Take 81 mg by mouth daily.   Yes [provider]  lisinopril-hydrochlorothiazide (PRINZIDE,ZESTORETIC) 10-12.5 MG tablet Take 1 tablet by mouth daily. Reported on 06/27/2015 06/27/15  Yes Malva Limes, MD  pravastatin (PRAVACHOL) 20 MG tablet TAKE 1 TABLET BY MOUTH DAILY 10/25/16  Yes Malva Limes, MD  tamsulosin (FLOMAX) 0.4 MG CAPS capsule TAKE 1 CAPSULE(0.4 MG) BY MOUTH AT BEDTIME Patient taking differently: TAKE 1 CAPSULE(0.4 MG) BY MOUTH DAILY 10/25/16  Yes Malva Limes, MD  benzonatate (TESSALON PERLES) 100 MG capsule Take 2 capsules (200 mg total) by mouth 3 (three) times daily as needed for cough. Patient not taking: Reported on 11/03/2016 02/01/16 01/31/17  Evon Slack, PA-C  omeprazole (PRILOSEC) 40 MG capsule TAKE ONE CAPSULE BY MOUTH EVERY DAY Patient not taking: Reported on 11/03/2016 10/25/16   Malva Limes, MD    Allergies Patient has no known allergies.  Family History  Problem Relation Age of Onset  . Hypertension Father   . CVA Father   . Alcohol abuse Brother   . Diabetes Brother     Social History Social History  Substance Use Topics  . Smoking status: Never Smoker  . Smokeless tobacco: Never Used  . Alcohol use Yes     Comment: Heavy drinking in 1990s on weekends    Review of  Systems  Constitutional: No fever/chills Eyes: No visual changes. ENT: No sore throat. Cardiovascular: Denies chest pain. Respiratory: Denies shortness of breath. Gastrointestinal: No abdominal pain.  No nausea, no vomiting.  No diarrhea.  No constipation. Genitourinary: Negative for dysuria. Musculoskeletal: Negative for back pain. Skin: Negative for rash. Neurological: Negative for headaches, focal weakness    ____________________________________________   PHYSICAL EXAM:  VITAL SIGNS: ED Triage Vitals  Enc Vitals Group     BP 11/03/16 0836 (!) 168/85     Pulse Rate 11/03/16 0836 70     Resp 11/03/16 0836 18     Temp 11/03/16 0836 98 F (36.7 C)     Temp Source 11/03/16 0836 Oral     SpO2 11/03/16 0836 99 %     Weight 11/03/16 0838 210 lb (95.3 kg)     Height 11/03/16 0838 5\' 8"  (1.727 m)     Head Circumference --      Peak Flow --      Pain Score --      Pain Loc --      Pain Edu? --      Excl. in GC? --    Constitutional: Alert and oriented. Well appearing and in no acute distress. Eyes: Conjunctivae are normal. PER. EOMI Head: Atraumatic. Nose: No congestion/rhinnorhea. Mouth/Throat: Mucous membranes are moist.  Oropharynx non-erythematous. Neck: No stridor.   Cardiovascular: Normal rate, regular rhythm. Grossly normal heart sounds.  Good peripheral circulation. Respiratory: Normal respiratory effort.  No retractions. Lungs CTAB. Gastrointestinal: Soft and nontender. No distention. No abdominal bruits. No CVA tenderness. Musculoskeletal: No lower extremity tenderness nor edema.  No joint effusions. Neurologic:  Normal speech and language. No gross focal neurologic deficits are appreciated. cranial nerves II through XII are intact other visual fields were not checked finger-nose rapid alternating movements in the hands are normal motor strength is 5 over 5 throughout Skin:  Skin is warm, dry and intact. No rash noted. Psychiatric: Mood and affect are normal. Speech and behavior are normal.  ____________________________________________   LABS (all labs ordered are listed, but only abnormal results are displayed)  Labs Reviewed  BASIC METABOLIC PANEL - Abnormal; Notable for the following:       Result Value   Potassium 3.4 (*)    Glucose, Bld 124 (*)    Creatinine, Ser 1.70 (*)    GFR calc non Af Amer 37 (*)    GFR calc Af Amer 43 (*)    All other components within  normal limits  CBC - Abnormal; Notable for the following:    MCV 79.1 (*)    MCH 25.5 (*)    RDW 14.8 (*)    All other components within normal limits  URINALYSIS, COMPLETE (UACMP) WITH MICROSCOPIC - Abnormal; Notable for the following:    Color, Urine YELLOW (*)    APPearance CLEAR (*)    All other components within normal limits  HEPATIC FUNCTION PANEL - Abnormal; Notable for the following:    Bilirubin, Direct <0.1 (*)    All other components within normal limits  BASIC METABOLIC PANEL - Abnormal; Notable for the following:    Creatinine, Ser 1.46 (*)    Calcium 8.6 (*)    GFR calc non Af Amer 44 (*)    GFR calc Af Amer 51 (*)    Anion gap 4 (*)    All other components within normal limits  TROPONIN I  DIFFERENTIAL  CBG MONITORING, ED   ____________________________________________  EKG  EKG  read and interpreted by me shows frequent PVCs slight left axis I believe the EKG is actually showing sinus rhythm although it's very difficult to tell with the very irregular baseline even on repeat. The monitor certainly is showing sinus rhythm. ____________________________________________  RADIOLOGY  Ct Head Wo Contrast  Result Date: 11/03/2016 CLINICAL DATA:  Syncope with fall. EXAM: CT HEAD WITHOUT CONTRAST CT CERVICAL SPINE WITHOUT CONTRAST TECHNIQUE: Multidetector CT imaging of the head and cervical spine was performed following the standard protocol without intravenous contrast. Multiplanar CT image reconstructions of the cervical spine were also generated. COMPARISON:  CT head and cervical spine dated November 06, 2012. FINDINGS: CT HEAD FINDINGS Brain: No evidence of acute infarction, hemorrhage, hydrocephalus, extra-axial collection or mass lesion/mass effect. Stable age related cerebral atrophy with ex vacuo dilatation of the ventricles. Stable periventricular and subcortical white matter hypodensities. Vascular: Atherosclerotic vascular calcification of the carotid siphons. No  hyperdense vessel. Skull: Normal. Negative for fracture or focal lesion. Sinuses/Orbits: No acute finding. Other: None. CT CERVICAL SPINE FINDINGS Alignment: Reversal of the normal cervical lordosis, centered at C4, unchanged. No traumatic malalignment. Skull base and vertebrae: No acute fracture. No primary bone lesion or focal pathologic process. Soft tissues and spinal canal: No prevertebral fluid or swelling. No visible canal hematoma. Disc levels: Severe degenerative disc disease from C3-C4 through C5-C6 with prominent vertebral body sclerosis and cystic change, similar to prior study. Mild multilevel neuroforaminal stenoses due to facet uncovertebral hypertrophy. Upper chest: Negative Other: None. IMPRESSION: 1. No acute intracranial abnormality. 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 2. No acute cervical spine fracture. 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 Electronically Signed   By: Obie Dredge M.D.   On: 11/03/2016 09:29   Ct Cervical Spine Wo Contrast  Result Date: 11/03/2016 CLINICAL DATA:  Syncope with fall. EXAM: CT HEAD WITHOUT CONTRAST CT CERVICAL SPINE WITHOUT CONTRAST TECHNIQUE: Multidetector CT imaging of the head and cervical spine was performed following the standard protocol without intravenous contrast. Multiplanar CT image reconstructions of the cervical spine were also generated. COMPARISON:  CT head and cervical spine dated November 06, 2012. FINDINGS: CT HEAD FINDINGS Brain: No evidence of acute infarction, hemorrhage, hydrocephalus, extra-axial collection or mass lesion/mass effect. Stable age related cerebral atrophy with ex vacuo dilatation of the ventricles. Stable periventricular and subcortical white matter hypodensities. Vascular: Atherosclerotic vascular calcification of the carotid siphons. No hyperdense vessel. Skull: Normal. Negative for fracture or focal lesion. Sinuses/Orbits: No acute finding.  Other: None. CT CERVICAL SPINE FINDINGS Alignment: Reversal of the normal cervical lordosis, centered at C4, unchanged. No traumatic malalignment. Skull base and vertebrae: No acute fracture. No primary bone lesion or focal pathologic process. Soft tissues and spinal canal: No prevertebral fluid or swelling. No visible canal hematoma. Disc levels: Severe degenerative disc disease from C3-C4 through C5-C6 with prominent vertebral body sclerosis and cystic change, similar to prior study. Mild multilevel neuroforaminal stenoses due to facet uncovertebral hypertrophy. Upper chest: Negative Other: None. IMPRESSION: 1. No acute intracranial abnormality. 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 2. No acute cervical spine fracture. 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 Electronically Signed   By: Obie Dredge M.D.   On: 11/03/2016 09:29    ____no acute pathology demonstrated on CT the head or neck________________________________________   PROCEDURES  Procedure(s) performed:   Procedures  Critical Care performed:   ____________________________________________   INITIAL IMPRESSION / ASSESSMENT AND PLAN / ED COURSE  As part of my medical decision making, I reviewed the following data within the electronic MEDICAL RECORD NUMBER old records here at this hospital and care everywhere were reviewed there really isn't anything useful at all and care everywhere. Patient feels much better after fluids GFR is improved creatinine has improved I will let him go I have discussed drinking plenty of fluids with him water and juice etc. he will contact his regular physician and arrange follow-up in the next few days. He will return if he is worse at all especially with the dizziness or lightheadedness and continued drinking.      ____________________________________________   FINAL CLINICAL IMPRESSION(S) / ED DIAGNOSES  Final diagnoses:    Dehydration  Syncope and collapse      NEW MEDICATIONS STARTED DURING THIS VISIT:  New Prescriptions   No medications on file     Note:  This document was prepared using Dragon voice recognition software and may include unintentional dictation errors.    Arnaldo NatalMalinda, Yanna Leaks F, MD 11/03/16 1400

## 2016-12-05 ENCOUNTER — Encounter: Payer: Self-pay | Admitting: Family Medicine

## 2016-12-05 ENCOUNTER — Ambulatory Visit (INDEPENDENT_AMBULATORY_CARE_PROVIDER_SITE_OTHER): Payer: Medicare HMO | Admitting: Family Medicine

## 2016-12-05 VITALS — BP 140/70 | HR 70 | Temp 98.0°F | Resp 16 | Ht 68.0 in | Wt 198.0 lb

## 2016-12-05 DIAGNOSIS — Z23 Encounter for immunization: Secondary | ICD-10-CM | POA: Diagnosis not present

## 2016-12-05 DIAGNOSIS — E7800 Pure hypercholesterolemia, unspecified: Secondary | ICD-10-CM | POA: Diagnosis not present

## 2016-12-05 DIAGNOSIS — E1122 Type 2 diabetes mellitus with diabetic chronic kidney disease: Secondary | ICD-10-CM | POA: Diagnosis not present

## 2016-12-05 DIAGNOSIS — N400 Enlarged prostate without lower urinary tract symptoms: Secondary | ICD-10-CM | POA: Diagnosis not present

## 2016-12-05 DIAGNOSIS — N182 Chronic kidney disease, stage 2 (mild): Secondary | ICD-10-CM

## 2016-12-05 DIAGNOSIS — I10 Essential (primary) hypertension: Secondary | ICD-10-CM | POA: Diagnosis not present

## 2016-12-05 LAB — COMPLETE METABOLIC PANEL WITH GFR
AG RATIO: 1.5 (calc) (ref 1.0–2.5)
ALBUMIN MSPROF: 4 g/dL (ref 3.6–5.1)
ALT: 20 U/L (ref 9–46)
AST: 18 U/L (ref 10–35)
Alkaline phosphatase (APISO): 68 U/L (ref 40–115)
BILIRUBIN TOTAL: 0.4 mg/dL (ref 0.2–1.2)
BUN / CREAT RATIO: 10 (calc) (ref 6–22)
BUN: 15 mg/dL (ref 7–25)
CHLORIDE: 107 mmol/L (ref 98–110)
CO2: 29 mmol/L (ref 20–32)
Calcium: 9.5 mg/dL (ref 8.6–10.3)
Creat: 1.55 mg/dL — ABNORMAL HIGH (ref 0.70–1.18)
GFR, EST AFRICAN AMERICAN: 49 mL/min/{1.73_m2} — AB (ref >=60)
GFR, Est Non African American: 42 mL/min/{1.73_m2} — ABNORMAL LOW (ref >=60)
GLUCOSE: 86 mg/dL (ref 65–139)
Globulin: 2.6 g/dL (calc) (ref 1.9–3.7)
Potassium: 3.8 mmol/L (ref 3.5–5.3)
Sodium: 142 mmol/L (ref 135–146)
TOTAL PROTEIN: 6.6 g/dL (ref 6.1–8.1)

## 2016-12-05 LAB — LIPID PANEL
Cholesterol: 169 mg/dL (ref <200)
HDL: 53 mg/dL (ref >40)
LDL CHOLESTEROL (CALC): 93 mg/dL
Non-HDL Cholesterol (Calc): 116 mg/dL (calc) (ref <130)
Total CHOL/HDL Ratio: 3.2 (calc) (ref <5.0)
Triglycerides: 129 mg/dL (ref <150)

## 2016-12-05 LAB — POCT GLYCOSYLATED HEMOGLOBIN (HGB A1C)
Est. average glucose Bld gHb Est-mCnc: 137
Hemoglobin A1C: 6.4

## 2016-12-05 LAB — CBC
HEMATOCRIT: 40.1 % (ref 38.5–50.0)
Hemoglobin: 13.2 g/dL (ref 13.2–17.1)
MCH: 25.6 pg — ABNORMAL LOW (ref 27.0–33.0)
MCHC: 32.9 g/dL (ref 32.0–36.0)
MCV: 77.9 fL — ABNORMAL LOW (ref 80.0–100.0)
MPV: 11.9 fL (ref 7.5–12.5)
PLATELETS: 181 10*3/uL (ref 140–400)
RBC: 5.15 10*6/uL (ref 4.20–5.80)
RDW: 13.9 % (ref 11.0–15.0)
WBC: 5.9 10*3/uL (ref 3.8–10.8)

## 2016-12-05 MED ORDER — OMEPRAZOLE 40 MG PO CPDR: 40.0000 mg | DELAYED_RELEASE_CAPSULE | Freq: Every day | ORAL | 1 refills | Status: DC

## 2016-12-05 MED ORDER — PRAVASTATIN SODIUM 20 MG PO TABS: 20.0000 mg | ORAL_TABLET | Freq: Every day | ORAL | 1 refills | Status: DC

## 2016-12-05 MED ORDER — AMLODIPINE BESYLATE 10 MG PO TABS: ORAL_TABLET | ORAL | 1 refills | Status: DC

## 2016-12-05 MED ORDER — TAMSULOSIN HCL 0.4 MG PO CAPS: ORAL_CAPSULE | ORAL | 1 refills | Status: DC

## 2016-12-05 NOTE — Progress Notes (Signed)
Patient: Ernest Lewis MaleAzzie Roup    DOB: 1940/05/07   76 y.o.   MRN: [id number] Visit Date: 12/05/2016  Today's Provider: Mila Merryonald Tinie Mcgloin, MD   Chief Complaint  Patient presents with  . Follow-up  . Diabetes  . Hypertension  . Hyperlipidemia   Subjective:    HPI   Diabetes Mellitus Type II, Follow-up:   Lab Results  Component Value Date   HGBA1C 6.6 06/27/2015   HGBA1C 6.3 (A) 03/24/2014   HGBA1C 6.7 (H) 04/21/2013   Last seen for diabetes 06/27/2015.  Management since then includes; no changes. He reports fair compliance with treatment. He is not having side effects. none Current symptoms include none and have been unchanged. Home blood sugar records: fasting range: not checking  Episodes of hypoglycemia? no   Current Insulin Regimen: n/a Most Recent Eye Exam: due-[facility] Weight trend: stable Prior visit with dietician: no Current diet: in general, an "unhealthy" diet Current exercise: gets a lot of exercise at work  ------------------------------------------------------------------------   Hypertension, follow-up:  BP Readings from Last 3 Encounters:  11/03/16 (!) 155/82  02/01/16 (!) 168/87  06/27/15 140/78    He was last seen for hypertension 06/27/2015.  BP at that visit was 140/78. Management since that visit includes; no changes.He reports good compliance with treatment. He is not having side effects. none He is exercising. He is not adherent to low salt diet.   Outside blood pressures are not checking. He is experiencing none.  Patient denies none.   Cardiovascular risk factors include diabetes mellitus.  Use of agents associated with hypertension: none.   ------------------------------------------------------------------------    Lipid/Cholesterol, Follow-up:   Last seen for this 09/27/2015.  Management since that visit includes; increased pravastatin to 20 mg qd. Labs ordered, but zero done.  Last Lipid Panel:    Component  Value Date/Time   CHOL 164 04/21/2013 1440   TRIG 90 04/21/2013 1440   HDL 39 (L) 04/21/2013 1440   VLDL 18 04/21/2013 1440   LDLCALC 107 (H) 04/21/2013 1440    He reports good compliance with treatment. He is not having side effects. none  Wt Readings from Last 3 Encounters:  11/03/16 210 lb (95.3 kg)  02/01/16 210 lb (95.3 kg)  06/27/15 206 lb (93.4 kg)    ------------------------------------------------------------------------  Chronic kidney disease (CKD), stage II (mild) From 06/27/2015-no changes. Labs ordered, but not done.    No Known Allergies   Current Outpatient Medications:  .  amLODipine (NORVASC) 10 MG tablet, TAKE 1 TABLET(10 MG) BY MOUTH DAILY, Disp: 30 tablet, Rfl: 0 .  aspirin EC 81 MG tablet, Take 81 mg by mouth daily., Disp: , Rfl:  .  lisinopril-hydrochlorothiazide (PRINZIDE,ZESTORETIC) 10-12.5 MG tablet, Take 1 tablet by mouth daily. Reported on 06/27/2015, Disp: 90 tablet, Rfl: 0 (not taking) .  omeprazole (PRILOSEC) 40 MG capsule, TAKE ONE CAPSULE BY MOUTH EVERY DAY (Patient not taking: Reported on 11/03/2016), Disp: 30 capsule, Rfl: 0 .  pravastatin (PRAVACHOL) 20 MG tablet, TAKE 1 TABLET BY MOUTH DAILY, Disp: 30 tablet, Rfl: 0 .  tamsulosin (FLOMAX) 0.4 MG CAPS capsule, TAKE 1 CAPSULE(0.4 MG) BY MOUTH AT BEDTIME (Patient taking differently: TAKE 1 CAPSULE(0.4 MG) BY MOUTH DAILY), Disp: 30 capsule, Rfl: 0  Review of Systems  Constitutional: Negative for appetite change, chills and fever.  Respiratory: Negative for chest tightness, shortness of breath and wheezing.   Cardiovascular: Negative for chest pain and palpitations.  Gastrointestinal: Negative for abdominal pain, nausea and  vomiting.    Social History   Tobacco Use  . Smoking status: Never Smoker  . Smokeless tobacco: Never Used  Substance Use Topics  . Alcohol use: Yes    Comment: Heavy drinking in 1990s on weekends   Objective:   BP 140/70 (BP Location: Right Arm, Patient Position:  Sitting, Cuff Size: Large)   Pulse 70   Temp 98 F (36.7 C) (Oral)   Resp 16   Ht 5\' 8"  (1.727 m)   Wt 198 lb (89.8 kg)   SpO2 99%   BMI 30.11 kg/m  There were no vitals filed for this visit.   Physical Exam   General Appearance:    Alert, cooperative, no distress  Eyes:    PERRL, conjunctiva/corneas clear, EOM's intact       Lungs:     Clear to auscultation bilaterally, respirations unlabored  Heart:    Regular rate and rhythm  Neurologic:   Awake, alert, oriented x 3. No apparent focal neurological           defect.        Results for orders placed or performed in visit on 12/05/16  POCT glycosylated hemoglobin (Hb A1C)  Result Value Ref Range   Hemoglobin A1C 6.4    Est. average glucose Bld gHb Est-mCnc 137    Diabetic Foot Exam - Simple   Simple Foot Form Diabetic Foot exam was performed with the following findings:  Yes 12/05/2016  4:07 PM  Visual Inspection Sensation Testing Pulse Check Comments    EKG: No acute changes.     Assessment & Plan:     1. Type 2 diabetes mellitus with stage 2 chronic kidney disease, without long-term current use of insulin (HCC)  - POCT glycosylated hemoglobin (Hb A1C) - COMPLETE METABOLIC PANEL WITH GFR - CBC  2. Need for influenza vaccination  - Flu vaccine HIGH DOSE PF  3. Essential hypertension Above goal for diabetes, but out of amlodipine which I refilled today.  Can stay off lisinopril-hctz for now. .   - COMPLETE METABOLIC PANEL WITH GFR - Lipid panel - EKG 12-Lead  4. Hypercholesteremia He is tolerating pravastatin well with no adverse effects.   - Lipid panel  5. BPH (benign prostatic hyperplasia) Doing well with tamsulosin - tamsulosin (FLOMAX) 0.4 MG CAPS capsule; TAKE 1 CAPSULE(0.4 MG) BY MOUTH AT BEDTIME  Dispense: 90 capsule; Refill: 1  6. Need for pneumococcal vaccine  - Pneumococcal conjugate vaccine 13-valent IM       Mila Merryonald Germani Gavilanes, MD  [facility]

## 2016-12-08 ENCOUNTER — Telehealth: Payer: Self-pay

## 2016-12-08 NOTE — Telephone Encounter (Signed)
-----   Message from Malva Limesonald E Fisher, MD sent at 12/07/2016  5:50 PM EST ----- LDL cholesterol is 93, needs to be under 70 due to diabetes. Increase pravastatin to 40mg  once a day, #30, rf x 4. Other labs are fine. Schedule follow up in 4 months.

## 2016-12-08 NOTE — Telephone Encounter (Signed)
Tried calling; pt's VM is not set up.  Thanks,   -Omero Kowal  

## 2016-12-10 MED ORDER — PRAVASTATIN SODIUM 40 MG PO TABS: 40.0000 mg | ORAL_TABLET | Freq: Every day | ORAL | 4 refills | Status: DC

## 2016-12-10 NOTE — Telephone Encounter (Signed)
Patient advised. RX sent to [facility]. 4 month follow up scheduled.  

## 2017-04-17 ENCOUNTER — Encounter: Payer: Self-pay | Admitting: Family Medicine

## 2017-04-17 ENCOUNTER — Ambulatory Visit (INDEPENDENT_AMBULATORY_CARE_PROVIDER_SITE_OTHER): Payer: Medicare HMO | Admitting: Family Medicine

## 2017-04-17 VITALS — BP 180/90 | HR 60 | Temp 98.5°F | Resp 16 | Ht 68.0 in | Wt 193.0 lb

## 2017-04-17 DIAGNOSIS — E7800 Pure hypercholesterolemia, unspecified: Secondary | ICD-10-CM | POA: Diagnosis not present

## 2017-04-17 DIAGNOSIS — I10 Essential (primary) hypertension: Secondary | ICD-10-CM

## 2017-04-17 DIAGNOSIS — E119 Type 2 diabetes mellitus without complications: Secondary | ICD-10-CM

## 2017-04-17 LAB — POCT UA - MICROALBUMIN: Microalbumin Ur, POC: 50 mg/L

## 2017-04-17 MED ORDER — LISINOPRIL 10 MG PO TABS: 10.0000 mg | ORAL_TABLET | Freq: Every day | ORAL | 2 refills | Status: DC

## 2017-04-17 NOTE — Progress Notes (Signed)
Patient: Ernest Lewis Male    DOB: 1940/02/16   77 y.o.   MRN: [id number] Visit Date: 04/17/2017  Today's Provider: Mila Merry, MD   Chief Complaint  Patient presents with  . Hypertension  . Diabetes  . Hyperlipidemia   Subjective:    HPI  Diabetes Mellitus Type II, Follow-up:   Lab Results  Component Value Date   HGBA1C 6.4 12/05/2016   HGBA1C 6.6 06/27/2015   HGBA1C 6.3 (A) 03/24/2014   Last seen for diabetes 5 months ago.  Management since then includes no changes. He reports good compliance with treatment. He is not having side effects.  Current symptoms include none and have been stable. Home blood sugar records: blood sugars are not being checked  Episodes of hypoglycemia? no   Current Insulin Regimen: none Most Recent Eye Exam: 1 year ago Weight trend: fluctuating a bit Prior visit with dietician: no Current diet: in general, an "unhealthy" diet Current exercise: weightlifting and has a physically demanding job  ------------------------------------------------------------------------   Hypertension, follow-up:  BP Readings from Last 3 Encounters:  12/05/16 140/70  11/03/16 (!) 155/82  02/01/16 (!) 168/87    He was last seen for hypertension 5 months ago.  BP at that visit was 140/70. Management since that visit includes no changes.He reports fair compliance with treatment. He reports occasionally missing some does of his medication.   He is not having side effects.  He is exercising. He is not adherent to low salt diet.   Outside blood pressures are not checked. He is experiencing none.  Patient denies chest pain, chest pressure/discomfort, claudication, dyspnea, exertional chest pressure/discomfort, fatigue, irregular heart beat, lower extremity edema, near-syncope, orthopnea, palpitations, paroxysmal nocturnal dyspnea, syncope and tachypnea.   Cardiovascular risk factors include advanced age (older than 68 for men, 15 for women),  diabetes mellitus, dyslipidemia, hypertension and male gender.  Use of agents associated with hypertension: NSAIDS.   ------------------------------------------------------------------------    Lipid/Cholesterol, Follow-up:   Last seen for this 5 months ago.  Management since that visit includes increasing Pravastatin to 40mg  daily.  Last Lipid Panel:    Component Value Date/Time   CHOL 169 12/05/2016 1623   CHOL 164 04/21/2013 1440   TRIG 129 12/05/2016 1623   TRIG 90 04/21/2013 1440   HDL 53 12/05/2016 1623   HDL 39 (L) 04/21/2013 1440   CHOLHDL 3.2 12/05/2016 1623   VLDL 18 04/21/2013 1440   LDLCALC 93 12/05/2016 1623   LDLCALC 107 (H) 04/21/2013 1440    He reports good compliance with treatment. Patient has not been watching his diet. He is not having side effects.   Wt Readings from Last 3 Encounters:  12/05/16 198 lb (89.8 kg)  11/03/16 210 lb (95.3 kg)  02/01/16 210 lb (95.3 kg)    ------------------------------------------------------------------------     No Known Allergies   Current Outpatient Medications:  .  amLODipine (NORVASC) 10 MG tablet, TAKE 1 TABLET(10 MG) BY MOUTH DAILY, Disp: 90 tablet, Rfl: 1 .  aspirin EC 81 MG tablet, Take 81 mg by mouth daily., Disp: , Rfl:  .  omeprazole (PRILOSEC) 40 MG capsule, Take 1 capsule (40 mg total) by mouth daily., Disp: 90 capsule, Rfl: 1 .  pravastatin (PRAVACHOL) 40 MG tablet, Take 1 tablet (40 mg total) by mouth daily., Disp: 30 tablet, Rfl: 4 .  tamsulosin (FLOMAX) 0.4 MG CAPS capsule, TAKE 1 CAPSULE(0.4 MG) BY MOUTH AT BEDTIME, Disp: 90 capsule, Rfl: 1  Review of  Systems  Constitutional: Negative for appetite change, chills and fever.  Respiratory: Negative for chest tightness, shortness of breath and wheezing.   Cardiovascular: Negative for chest pain and palpitations.  Gastrointestinal: Negative for abdominal pain, nausea and vomiting.  Neurological: Positive for headaches.    Social History    Tobacco Use  . Smoking status: Never Smoker  . Smokeless tobacco: Never Used  Substance Use Topics  . 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   Objective:   BP (!) 180/90 (BP Location: Left Arm, Cuff Size: Large)   Pulse 60   Temp 98.5 F (36.9 C) (Oral)   Resp 16   Ht 5\' 8"  (1.727 m)   Wt 193 lb (87.5 kg)   SpO2 97% Comment: room air  BMI 29.35 kg/m  Vitals:   04/17/17 1538 04/17/17 1541  BP: (!) 172/84 (!) 180/90  Pulse: 60   Resp: 16   Temp: 98.5 F (36.9 C)   TempSrc: Oral   SpO2: 97%   Weight: 193 lb (87.5 kg)   Height: 5\' 8"  (1.727 m)      Physical Exam   General Appearance:    Alert, cooperative, no distress  Eyes:    PERRL, conjunctiva/corneas clear, EOM's intact       Lungs:     Clear to auscultation bilaterally, respirations unlabored  Heart:    Regular rate and rhythm  Neurologic:   Awake, alert, oriented x 3. No apparent focal neurological           defect.       Results for orders placed or performed in visit on 04/17/17  POCT UA - Microalbumin  Result Value Ref Range   Microalbumin Ur, POC 50 mg/L   Creatinine, POC n/a mg/dL   Albumin/Creatinine Ratio, Urine, POC n/a         Assessment & Plan:     1. Type 2 diabetes mellitus without complication, without long-term current use of insulin (HCC) Due for a1c.  - POCT UA - Microalbumin - Hemoglobin A1c  2. Hypercholesteremia He is tolerating pravastatin well with no adverse effects.   - Direct LDL - Comprehensive metabolic panel  3. Essential hypertension Uncontrolled, add lisinopril 10mg  a day.   Return in about 8 weeks (around 06/12/2017).        Mila Merryonald Fisher, MD  [facility]

## 2017-04-18 LAB — COMPREHENSIVE METABOLIC PANEL WITH GFR
ALT: 24 IU/L (ref 0–44)
AST: 24 IU/L (ref 0–40)
Albumin/Globulin Ratio: 1.8 (ref 1.2–2.2)
Albumin: 4.4 g/dL (ref 3.5–4.8)
Alkaline Phosphatase: 71 IU/L (ref 39–117)
BUN/Creatinine Ratio: 10 (ref 10–24)
BUN: 14 mg/dL (ref 8–27)
Bilirubin Total: 0.4 mg/dL (ref 0.0–1.2)
CO2: 23 mmol/L (ref 20–29)
Calcium: 9.7 mg/dL (ref 8.6–10.2)
Chloride: 106 mmol/L (ref 96–106)
Creatinine, Ser: 1.41 mg/dL — ABNORMAL HIGH (ref 0.76–1.27)
GFR calc Af Amer: 55 mL/min/1.73 — ABNORMAL LOW
GFR calc non Af Amer: 47 mL/min/1.73 — ABNORMAL LOW
Globulin, Total: 2.5 g/dL (ref 1.5–4.5)
Glucose: 82 mg/dL (ref 65–99)
Potassium: 3.9 mmol/L (ref 3.5–5.2)
Sodium: 145 mmol/L — ABNORMAL HIGH (ref 134–144)
Total Protein: 6.9 g/dL (ref 6.0–8.5)

## 2017-04-18 LAB — HEMOGLOBIN A1C
ESTIMATED AVERAGE GLUCOSE: 126 mg/dL
Hgb A1c MFr Bld: 6 % — ABNORMAL HIGH (ref 4.8–5.6)

## 2017-04-18 LAB — LDL CHOLESTEROL, DIRECT: LDL Direct: 77 mg/dL (ref 0–99)

## 2017-04-20 ENCOUNTER — Telehealth: Payer: Self-pay | Admitting: Emergency Medicine

## 2017-04-20 DIAGNOSIS — E7800 Pure hypercholesterolemia, unspecified: Secondary | ICD-10-CM

## 2017-04-20 MED ORDER — PRAVASTATIN SODIUM 80 MG PO TABS: 80.0000 mg | ORAL_TABLET | Freq: Every day | ORAL | 6 refills | Status: DC

## 2017-04-20 NOTE — Telephone Encounter (Signed)
-----   Message from Malva Limesonald E Fisher, MD sent at 04/19/2017 10:22 AM EDT ----- Sugar is well controlled, ac is 6.0% LDL cholesterol is 77, needs to be under 70. Need to increase pravastatin to 80mg  once a day, #30, rf x 6. Schedule follow up for cholesterol and BP in 4-5 months.

## 2017-04-20 NOTE — Telephone Encounter (Signed)
Pt informed and voiced understanding of results. Med sent in. appt made.

## 2017-06-12 ENCOUNTER — Ambulatory Visit: Payer: Self-pay | Admitting: Family Medicine

## 2017-06-12 NOTE — Progress Notes (Deleted)
       Patient: Ernest RoupRobert C Lewis Male    DOB: 1940/07/29   77 y.o.   MRN: [id number] Visit Date: 06/12/2017  Today's Provider: Mila Merryonald Fisher, MD   No chief complaint on file.  Subjective:    HPI   Hypertension, follow-up:  BP Readings from Last 3 Encounters:  04/17/17 (!) 180/90  12/05/16 140/70  11/03/16 (!) 155/82    He was last seen for hypertension 2 months ago.  BP at that visit was 180/90. Management since that visit includes; uncontrolled. Added lisinopril 10mg  qd. Advised to recheck in 8 weeks.He reports {excellent/good/fair/poor:19665} compliance with treatment. He {ACTION; IS/IS [id number]}OT:[id number]} having side effects. *** He {is/is not:9024} exercising. He {is/is not:9024} adherent to low salt diet.   Outside blood pressures are ***. He is experiencing {Symptoms; cardiac:12860}.  Patient denies {Symptoms; cardiac:12860}.   Cardiovascular risk factors include {cv risk factors:510}.  Use of agents associated with hypertension: {bp agents assoc with hypertension:511::"none"}.   ------------------------------------------------------------------------    No Known Allergies   Current Outpatient Medications:  .  amLODipine (NORVASC) 10 MG tablet, TAKE 1 TABLET(10 MG) BY MOUTH DAILY, Disp: 90 tablet, Rfl: 1 .  aspirin EC 81 MG tablet, Take 81 mg by mouth daily., Disp: , Rfl:  .  lisinopril (PRINIVIL,ZESTRIL) 10 MG tablet, Take 1 tablet (10 mg total) by mouth daily., Disp: 30 tablet, Rfl: 2 .  omeprazole (PRILOSEC) 40 MG capsule, Take 1 capsule (40 mg total) by mouth daily., Disp: 90 capsule, Rfl: 1 .  pravastatin (PRAVACHOL) 80 MG tablet, Take 1 tablet (80 mg total) by mouth daily., Disp: 30 tablet, Rfl: 6 .  tamsulosin (FLOMAX) 0.4 MG CAPS capsule, TAKE 1 CAPSULE(0.4 MG) BY MOUTH AT BEDTIME, Disp: 90 capsule, Rfl: 1  Review of Systems  Constitutional: Negative for appetite change, chills and fever.  Respiratory: Negative for chest tightness, shortness of breath and  wheezing.   Cardiovascular: Negative for chest pain and palpitations.  Gastrointestinal: Negative for abdominal pain, nausea and vomiting.    Social History   Tobacco Use  . Smoking status: Never Smoker  . Smokeless tobacco: Never Used  Substance Use Topics  . 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 There were no vitals filed for this visit.   Physical Exam      Assessment & Plan:           Mila Merryonald Fisher, MD  [facility]

## 2017-06-18 NOTE — Progress Notes (Deleted)
       Patient: Ernest RoupRobert C Delorenzo Male    DOB: 1940/02/21   77 y.o.   MRN: [id number] Visit Date: 06/18/2017  Today's Provider: Mila Merryonald Fisher, MD   No chief complaint on file.  Subjective:    HPI   Hypertension, follow-up:  BP Readings from Last 3 Encounters:  04/17/17 (!) 180/90  12/05/16 140/70  11/03/16 (!) 155/82    He was last seen for hypertension 2 months ago.  BP at that visit was 180/90. Management since that visit includes; uncontrolled. Added lisinopril 10 mg qd. Advised to recheck in 8 weeks.He reports {excellent/good/fair/poor:19665} compliance with treatment. He {ACTION; IS/IS [id number]}OT:21021397} having side effects. *** He {is/is not:9024} exercising. He {is/is not:9024} adherent to low salt diet.   Outside blood pressures are ***. He is experiencing {Symptoms; cardiac:12860}.  Patient denies {Symptoms; cardiac:12860}.   Cardiovascular risk factors include {cv risk factors:510}.  Use of agents associated with hypertension: {bp agents assoc with hypertension:511::"none"}.   ------------------------------------------------------------------------    No Known Allergies   Current Outpatient Medications:  .  amLODipine (NORVASC) 10 MG tablet, TAKE 1 TABLET(10 MG) BY MOUTH DAILY, Disp: 90 tablet, Rfl: 1 .  aspirin EC 81 MG tablet, Take 81 mg by mouth daily., Disp: , Rfl:  .  lisinopril (PRINIVIL,ZESTRIL) 10 MG tablet, Take 1 tablet (10 mg total) by mouth daily., Disp: 30 tablet, Rfl: 2 .  omeprazole (PRILOSEC) 40 MG capsule, Take 1 capsule (40 mg total) by mouth daily., Disp: 90 capsule, Rfl: 1 .  pravastatin (PRAVACHOL) 80 MG tablet, Take 1 tablet (80 mg total) by mouth daily., Disp: 30 tablet, Rfl: 6 .  tamsulosin (FLOMAX) 0.4 MG CAPS capsule, TAKE 1 CAPSULE(0.4 MG) BY MOUTH AT BEDTIME, Disp: 90 capsule, Rfl: 1  Review of Systems  Constitutional: Negative for appetite change, chills and fever.  Respiratory: Negative for chest tightness, shortness of breath and  wheezing.   Cardiovascular: Negative for chest pain and palpitations.  Gastrointestinal: Negative for abdominal pain, nausea and vomiting.    Social History   Tobacco Use  . Smoking status: Never Smoker  . Smokeless tobacco: Never Used  Substance Use Topics  . 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 There were no vitals filed for this visit.   Physical Exam      Assessment & Plan:           Mila Merryonald Fisher, MD  [facility]

## 2017-06-19 ENCOUNTER — Ambulatory Visit: Payer: Self-pay | Admitting: Family Medicine

## 2017-07-17 ENCOUNTER — Other Ambulatory Visit: Payer: Self-pay | Admitting: Family Medicine

## 2017-09-01 ENCOUNTER — Ambulatory Visit: Payer: Self-pay | Admitting: Family Medicine

## 2017-09-01 NOTE — Progress Notes (Deleted)
Patient: Ernest Lewis Male    DOB: January 12, 1940   77 y.o.   MRN: [id number] Visit Date: 09/01/2017  Today's Provider: Mila Merry, MD   No chief complaint on file.  Subjective:    HPI   Diabetes Mellitus Type II, Follow-up:   Lab Results  Component Value Date   HGBA1C 6.0 (H) 04/17/2017   HGBA1C 6.4 12/05/2016   HGBA1C 6.6 06/27/2015   Last seen for diabetes 4 months ago.  Management since then includes; labs checked, no changes. He reports {excellent/good/fair/poor:19665} compliance with treatment. He {ACTION; IS/IS VQQ:[id number]} having side effects. *** Current symptoms include {Symptoms; diabetes:14075} and have been {Desc; course:15616}. Home blood sugar records: {diabetes glucometry results:16657}  Episodes of hypoglycemia? {yes***/no:17258}   Current Insulin Regimen: *** Most Recent Eye Exam: *** Weight trend: {trend:16658} Prior visit with dietician: {yes/no:17258} Current diet: {diet habits:16563} Current exercise: {exercise types:16438}  ------------------------------------------------------------------------   Hypertension, follow-up:  BP Readings from Last 3 Encounters:  04/17/17 (!) 180/90  12/05/16 140/70  11/03/16 (!) 155/82    He was last seen for hypertension 4 months ago.  BP at that visit was 180/90. Management since that visit includes; added lisinopril 10 mg qd.He reports {excellent/good/fair/poor:19665} compliance with treatment. He {ACTION; IS/IS IEP:32951884} having side effects. *** He {is/is not:9024} exercising. He {is/is not:9024} adherent to low salt diet.   Outside blood pressures are ***. He is experiencing {Symptoms; cardiac:12860}.  Patient denies {Symptoms; cardiac:12860}.   Cardiovascular risk factors include {cv risk factors:510}.  Use of agents associated with hypertension: {bp agents assoc with hypertension:511::"none"}.    ------------------------------------------------------------------------    Lipid/Cholesterol, Follow-up:   Last seen for this 4 months ago.  Management since that visit includes; labs checked. Increased pravastatin to 80 mg qd.  Last Lipid Panel:    Component Value Date/Time   CHOL 169 12/05/2016 1623   CHOL 164 04/21/2013 1440   TRIG 129 12/05/2016 1623   TRIG 90 04/21/2013 1440   HDL 53 12/05/2016 1623   HDL 39 (L) 04/21/2013 1440   CHOLHDL 3.2 12/05/2016 1623   VLDL 18 04/21/2013 1440   LDLCALC 93 12/05/2016 1623   LDLCALC 107 (H) 04/21/2013 1440   LDLDIRECT 77 04/17/2017 1620    He reports {excellent/good/fair/poor:19665} compliance with treatment. He {ACTION; IS/IS ZYS:[id number]} having side effects. ***  Wt Readings from Last 3 Encounters:  04/17/17 193 lb (87.5 kg)  12/05/16 198 lb (89.8 kg)  11/03/16 210 lb (95.3 kg)    ------------------------------------------------------------------------    No Known Allergies   Current Outpatient Medications:  .  amLODipine (NORVASC) 10 MG tablet, TAKE 1 TABLET(10 MG) BY MOUTH DAILY, Disp: 90 tablet, Rfl: 3 .  amLODipine (NORVASC) 10 MG tablet, TAKE 1 TABLET(10 MG) BY MOUTH DAILY, Disp: 90 tablet, Rfl: 3 .  aspirin EC 81 MG tablet, Take 81 mg by mouth daily., Disp: , Rfl:  .  lisinopril (PRINIVIL,ZESTRIL) 10 MG tablet, Take 1 tablet (10 mg total) by mouth daily., Disp: 30 tablet, Rfl: 2 .  omeprazole (PRILOSEC) 40 MG capsule, TAKE 1 CAPSULE(40 MG) BY MOUTH DAILY, Disp: 90 capsule, Rfl: 3 .  pravastatin (PRAVACHOL) 40 MG tablet, TAKE 1 TABLET(40 MG) BY MOUTH DAILY, Disp: 90 tablet, Rfl: 4 .  pravastatin (PRAVACHOL) 80 MG tablet, Take 1 tablet (80 mg total) by mouth daily., Disp: 30 tablet, Rfl: 6 .  tamsulosin (FLOMAX) 0.4 MG CAPS capsule, TAKE 1 CAPSULE(0.4 MG) BY MOUTH AT BEDTIME, Disp: 90 capsule, Rfl: 1  Review of  Systems  Constitutional: Negative for appetite change, chills and fever.  Respiratory: Negative for  chest tightness, shortness of breath and wheezing.   Cardiovascular: Negative for chest pain and palpitations.  Gastrointestinal: Negative for abdominal pain, nausea and vomiting.    Social History   Tobacco Use  . Smoking status: Never Smoker  . Smokeless tobacco: Never Used  Substance Use Topics  . 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 There were no vitals filed for this visit.   Physical Exam      Assessment & Plan:           Mila Merryonald Fisher, MD  [facility]

## 2017-09-04 ENCOUNTER — Ambulatory Visit: Payer: Self-pay | Admitting: Family Medicine

## 2017-09-04 ENCOUNTER — Ambulatory Visit (INDEPENDENT_AMBULATORY_CARE_PROVIDER_SITE_OTHER): Payer: Medicare HMO | Admitting: Family Medicine

## 2017-09-04 ENCOUNTER — Encounter: Payer: Self-pay | Admitting: Family Medicine

## 2017-09-04 VITALS — BP 190/81 | HR 61 | Temp 98.7°F | Resp 16 | Ht 68.0 in | Wt 184.0 lb

## 2017-09-04 DIAGNOSIS — E7800 Pure hypercholesterolemia, unspecified: Secondary | ICD-10-CM | POA: Diagnosis not present

## 2017-09-04 DIAGNOSIS — Z23 Encounter for immunization: Secondary | ICD-10-CM

## 2017-09-04 DIAGNOSIS — N400 Enlarged prostate without lower urinary tract symptoms: Secondary | ICD-10-CM

## 2017-09-04 DIAGNOSIS — I10 Essential (primary) hypertension: Secondary | ICD-10-CM

## 2017-09-04 DIAGNOSIS — E119 Type 2 diabetes mellitus without complications: Secondary | ICD-10-CM | POA: Diagnosis not present

## 2017-09-04 LAB — POCT GLYCOSYLATED HEMOGLOBIN (HGB A1C)
Est. average glucose Bld gHb Est-mCnc: 128
Hemoglobin A1C: 6.1 % — AB (ref 4.0–5.6)

## 2017-09-04 MED ORDER — AMLODIPINE BESYLATE 5 MG PO TABS: ORAL_TABLET | ORAL | 1 refills | Status: AC

## 2017-09-04 MED ORDER — PRAVASTATIN SODIUM 80 MG PO TABS: 80.0000 mg | ORAL_TABLET | Freq: Every day | ORAL | 1 refills | Status: DC

## 2017-09-04 MED ORDER — LISINOPRIL 10 MG PO TABS: 10.0000 mg | ORAL_TABLET | Freq: Every day | ORAL | 1 refills | Status: DC

## 2017-09-04 MED ORDER — OMEPRAZOLE 40 MG PO CPDR: 40.0000 mg | DELAYED_RELEASE_CAPSULE | Freq: Every day | ORAL | 1 refills | Status: DC

## 2017-09-04 MED ORDER — TAMSULOSIN HCL 0.4 MG PO CAPS: ORAL_CAPSULE | ORAL | 1 refills | Status: DC

## 2017-09-04 NOTE — Progress Notes (Signed)
Patient: Ernest Lewis Male    DOB: 1940/02/11   77 y.o.   MRN: [id number] Visit Date: 09/04/2017  Today's Provider: Mila Merryonald Yamen Castrogiovanni, MD   Chief Complaint  Patient presents with  . Diabetes  . Hypertension  . Hyperlipidemia   Subjective:    HPI  Diabetes Mellitus Type II, Follow-up:   Lab Results  Component Value Date   HGBA1C 6.0 (H) 04/17/2017   HGBA1C 6.4 12/05/2016   HGBA1C 6.6 06/27/2015   Last seen for diabetes 4 months ago.  Management since then includes no changes. He reports fair compliance with treatment. He is not having side effects.  Current symptoms include none and have been stable. Home blood sugar records: blood sugars are not checked  Episodes of hypoglycemia? no   Current Insulin Regimen: none Most Recent Eye Exam: 1 year ago Weight trend: decreasing steadily Prior visit with dietician: no Current diet: in general, an "unhealthy" diet Current exercise: walking  ------------------------------------------------------------------------   Hypertension, follow-up:  BP Readings from Last 3 Encounters:  09/04/17 (!) 190/81  04/17/17 (!) 180/90  12/05/16 140/70    He was last seen for hypertension 4 months ago.  BP at that visit was 180/90. Management since that visit includes adding Lisinopril 10mg  daily.He reports fair compliance with treatment. Patient reports he has been out of medication for the past 2 months. He is not having side effects.  He is exercising. He is not adherent to low salt diet.   Outside blood pressures are not being checked. He is experiencing none.  Patient denies chest pain, chest pressure/discomfort, claudication, dyspnea, exertional chest pressure/discomfort, fatigue, irregular heart beat, lower extremity edema, near-syncope, orthopnea, palpitations, paroxysmal nocturnal dyspnea, syncope and tachypnea.   Cardiovascular risk factors include advanced age (older than 5455 for men, 7565 for women), diabetes mellitus,  dyslipidemia, hypertension and male gender.  Use of agents associated with hypertension: NSAIDS.   ------------------------------------------------------------------------    Lipid/Cholesterol, Follow-up:   Last seen for this 4 months ago.  Management since that visit includes increased Pravastatin to 80mg  daily.  Last Lipid Panel:    Component Value Date/Time   CHOL 169 12/05/2016 1623   CHOL 164 04/21/2013 1440   TRIG 129 12/05/2016 1623   TRIG 90 04/21/2013 1440   HDL 53 12/05/2016 1623   HDL 39 (L) 04/21/2013 1440   CHOLHDL 3.2 12/05/2016 1623   VLDL 18 04/21/2013 1440   LDLCALC 93 12/05/2016 1623   LDLCALC 107 (H) 04/21/2013 1440   LDLDIRECT 77 04/17/2017 1620    He reports fair compliance with treatment. Patient has been out of medication for the past 2 months. He is not having side effects.   Wt Readings from Last 3 Encounters:  09/04/17 184 lb (83.5 kg)  04/17/17 193 lb (87.5 kg)  12/05/16 198 lb (89.8 kg)    ------------------------------------------------------------------------     No Known Allergies   Current Outpatient Medications:  .  aspirin EC 81 MG tablet, Take 81 mg by mouth daily., Disp: , Rfl:  .  amLODipine (NORVASC) 10 MG tablet, TAKE 1 TABLET(10 MG) BY MOUTH DAILY (Patient not taking: Reported on 09/04/2017), Disp: 90 tablet, Rfl: 3 .  amLODipine (NORVASC) 10 MG tablet, TAKE 1 TABLET(10 MG) BY MOUTH DAILY, Disp: 90 tablet, Rfl: 3 .  lisinopril (PRINIVIL,ZESTRIL) 10 MG tablet, Take 1 tablet (10 mg total) by mouth daily. (Patient not taking: Reported on 09/04/2017), Disp: 30 tablet, Rfl: 2 .  omeprazole (PRILOSEC) 40 MG capsule,  TAKE 1 CAPSULE(40 MG) BY MOUTH DAILY (Patient not taking: Reported on 09/04/2017), Disp: 90 capsule, Rfl: 3 .  pravastatin (PRAVACHOL) 80 MG tablet, Take 1 tablet (80 mg total) by mouth daily. (Patient not taking: Reported on 09/04/2017), Disp: 30 tablet, Rfl: 6 .  tamsulosin (FLOMAX) 0.4 MG CAPS capsule, TAKE 1 CAPSULE(0.4  MG) BY MOUTH AT BEDTIME (Patient not taking: Reported on 09/04/2017), Disp: 90 capsule, Rfl: 1  Review of Systems  Constitutional: Negative for appetite change, chills and fever.  Respiratory: Negative for chest tightness, shortness of breath and wheezing.   Cardiovascular: Negative for chest pain and palpitations.  Gastrointestinal: Negative for abdominal pain, nausea and vomiting.    Social History   Tobacco Use  . Smoking status: Never Smoker  . Smokeless tobacco: Never Used  Substance Use Topics  . 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   Objective:   BP (!) 190/81 (BP Location: Left Arm, Patient Position: Sitting, Cuff Size: Large)   Pulse 61   Temp 98.7 F (37.1 C) (Oral)   Resp 16   Ht 5\' 8"  (1.727 m)   Wt 184 lb (83.5 kg)   SpO2 99% Comment: room air  BMI 27.98 kg/m  Vitals:   09/04/17 1613  BP: (!) 190/81  Pulse: 61  Resp: 16  Temp: 98.7 F (37.1 C)  TempSrc: Oral  SpO2: 99%  Weight: 184 lb (83.5 kg)  Height: 5\' 8"  (1.727 m)     Physical Exam   General Appearance:    Alert, cooperative, no distress  Eyes:    PERRL, conjunctiva/corneas clear, EOM's intact       Lungs:     Clear to auscultation bilaterally, respirations unlabored  Heart:    Regular rate and rhythm  Neurologic:   Awake, alert, oriented x 3. No apparent focal neurological           defect.        Results for orders placed or performed in visit on 09/04/17  POCT HgB A1C  Result Value Ref Range   Hemoglobin A1C 6.1 (A) 4.0 - 5.6 %   HbA1c POC (<> result, manual entry)     HbA1c, POC (prediabetic range)     HbA1c, POC (controlled diabetic range)     Est. average glucose Bld gHb Est-mCnc 128        Assessment & Plan:     1. Type 2 diabetes mellitus without complication, without long-term current use of insulin (HCC) Well controlled.  Continue current medications.  Diet controlled.  - POCT HgB A1C  2. Essential hypertension Uncontrolled since  running out of medications. Start back on - lisinopril (PRINIVIL,ZESTRIL) 10 MG tablet; Take 1 tablet (10 mg total) by mouth daily.  Dispense: 90 tablet; Refill: 1  Restart amlodipine at 5mg  daily   3. Hypercholesteremia Off of statin, rf- pravastatin (PRAVACHOL) 80 MG tablet; Take 1 tablet (80 mg total) by mouth daily.  Dispense: 90 tablet; Refill: 1  4. BPH (benign prostatic hyperplasia) Refill.  - tamsulosin (FLOMAX) 0.4 MG CAPS capsule; TAKE 1 CAPSULE(0.4 MG) BY MOUTH AT BEDTIME  Dispense: 90 capsule; Refill: 1  5. Need for influenza vaccination  - Flu vaccine HIGH DOSE PF (Fluzone High dose)       Mila Merry, MD  [facility]

## 2017-09-04 NOTE — Patient Instructions (Signed)
   Start back on your medication today

## 2017-09-04 NOTE — Progress Notes (Deleted)
Patient: Ernest RoupRobert C Vickroy Male    DOB: 12/17/1940   77 y.o.   MRN: [id number] Visit Date: 09/04/2017  Today's Provider: Mila Merryonald Fisher, MD   No chief complaint on file.  Subjective:    HPI   Diabetes Mellitus Type II, Follow-up:   Lab Results  Component Value Date   HGBA1C 6.0 (H) 04/17/2017   HGBA1C 6.4 12/05/2016   HGBA1C 6.6 06/27/2015   Last seen for diabetes 4 months ago.  Management since then includes labs checked, no changes. He reports {excellent/good/fair/poor:19665} compliance with treatment. He {ACTION; IS/IS [id number]}OT:21021397} having side effects. *** Current symptoms include {Symptoms; diabetes:14075} and have been {Desc; course:15616}. Home blood sugar records: {diabetes glucometry results:16657}  Episodes of hypoglycemia? {yes***/no:17258}   Current Insulin Regimen: *** Most Recent Eye Exam: *** Weight trend: {trend:16658} Prior visit with dietician: {yes/no:17258} Current diet: {diet habits:16563} Current exercise: {exercise types:16438}  ------------------------------------------------------------------------   Hypertension, follow-up:  BP Readings from Last 3 Encounters:  04/17/17 (!) 180/90  12/05/16 140/70  11/03/16 (!) 155/82    He was last seen for hypertension 4 months ago.  BP at that visit was 180/90. Management since that visit includes; added lisinopril 10 mg qd.He reports {excellent/good/fair/poor:19665} compliance with treatment. He {ACTION; IS/IS [id number]}OT:21021397} having side effects. *** He {is/is not:9024} exercising. He {is/is not:9024} adherent to low salt diet.   Outside blood pressures are ***. He is experiencing {Symptoms; cardiac:12860}.  Patient denies {Symptoms; cardiac:12860}.   Cardiovascular risk factors include {cv risk factors:510}.  Use of agents associated with hypertension: {bp agents assoc with hypertension:511::"none"}.    ------------------------------------------------------------------------    Lipid/Cholesterol, Follow-up:   Last seen for this 4 months ago.  Management since that visit includes; labs checked. Increased pravastatin to 80 mg qd.  Last Lipid Panel:    Component Value Date/Time   CHOL 169 12/05/2016 1623   CHOL 164 04/21/2013 1440   TRIG 129 12/05/2016 1623   TRIG 90 04/21/2013 1440   HDL 53 12/05/2016 1623   HDL 39 (L) 04/21/2013 1440   CHOLHDL 3.2 12/05/2016 1623   VLDL 18 04/21/2013 1440   LDLCALC 93 12/05/2016 1623   LDLCALC 107 (H) 04/21/2013 1440   LDLDIRECT 77 04/17/2017 1620    He reports {excellent/good/fair/poor:19665} compliance with treatment. He {ACTION; IS/IS [id number]:[id number]}OT:21021397} having side effects. ***  Wt Readings from Last 3 Encounters:  04/17/17 193 lb (87.5 kg)  12/05/16 198 lb (89.8 kg)  11/03/16 210 lb (95.3 kg)    ------------------------------------------------------------------------    No Known Allergies   Current Outpatient Medications:  .  amLODipine (NORVASC) 10 MG tablet, TAKE 1 TABLET(10 MG) BY MOUTH DAILY, Disp: 90 tablet, Rfl: 3 .  amLODipine (NORVASC) 10 MG tablet, TAKE 1 TABLET(10 MG) BY MOUTH DAILY, Disp: 90 tablet, Rfl: 3 .  aspirin EC 81 MG tablet, Take 81 mg by mouth daily., Disp: , Rfl:  .  lisinopril (PRINIVIL,ZESTRIL) 10 MG tablet, Take 1 tablet (10 mg total) by mouth daily., Disp: 30 tablet, Rfl: 2 .  omeprazole (PRILOSEC) 40 MG capsule, TAKE 1 CAPSULE(40 MG) BY MOUTH DAILY, Disp: 90 capsule, Rfl: 3 .  pravastatin (PRAVACHOL) 40 MG tablet, TAKE 1 TABLET(40 MG) BY MOUTH DAILY, Disp: 90 tablet, Rfl: 4 .  pravastatin (PRAVACHOL) 80 MG tablet, Take 1 tablet (80 mg total) by mouth daily., Disp: 30 tablet, Rfl: 6 .  tamsulosin (FLOMAX) 0.4 MG CAPS capsule, TAKE 1 CAPSULE(0.4 MG) BY MOUTH AT BEDTIME, Disp: 90 capsule, Rfl: 1  Review of  Systems  Constitutional: Negative for appetite change, chills and fever.  Respiratory: Negative for  chest tightness, shortness of breath and wheezing.   Cardiovascular: Negative for chest pain and palpitations.  Gastrointestinal: Negative for abdominal pain, nausea and vomiting.    Social History   Tobacco Use  . Smoking status: Never Smoker  . Smokeless tobacco: Never Used  Substance Use Topics  . 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 There were no vitals filed for this visit.   Physical Exam      Assessment & Plan:           Mila Merryonald Fisher, MD  [facility]

## 2017-10-22 ENCOUNTER — Ambulatory Visit (INDEPENDENT_AMBULATORY_CARE_PROVIDER_SITE_OTHER): Payer: Medicare HMO

## 2017-10-22 VITALS — BP 184/72 | HR 60 | Temp 99.1°F | Ht 68.0 in | Wt 185.4 lb

## 2017-10-22 DIAGNOSIS — E1122 Type 2 diabetes mellitus with diabetic chronic kidney disease: Secondary | ICD-10-CM

## 2017-10-22 DIAGNOSIS — Z0000 Encounter for general adult medical examination without abnormal findings: Secondary | ICD-10-CM | POA: Diagnosis not present

## 2017-10-22 DIAGNOSIS — N182 Chronic kidney disease, stage 2 (mild): Secondary | ICD-10-CM

## 2017-10-22 DIAGNOSIS — Z23 Encounter for immunization: Secondary | ICD-10-CM | POA: Diagnosis not present

## 2017-10-22 NOTE — Progress Notes (Signed)
Pts BP was elevated on both attempts today. Pt states that he has not had his BP meds yet today but will go home and take them. Pt declined any abnormal s/s (SOB, chest pain, blurred vision or dizziness, tingling or numbness in arm) and states he feels fine. Per history, pt typically runs high. Advised pt to monitor BP readings and record. Pt to discuss further with PCP at [facility] on 11/04/17. FYI! -MM

## 2017-10-22 NOTE — Patient Instructions (Addendum)
Ernest Lewis , Thank you for taking time to come for your Medicare Wellness Visit. I appreciate your ongoing commitment to your health goals. Please review the following plan we discussed and let me know if I can assist you in the future.   Screening recommendations/referrals: Colonoscopy: N/A Recommended yearly ophthalmology/optometry visit for glaucoma screening and checkup Recommended yearly dental visit for hygiene and checkup  Vaccinations: Influenza vaccine: Up to date Pneumococcal vaccine: No due for Pneumovax23 until 12/05/17 Tdap vaccine: Pt declines today.  Shingles vaccine: Pt declines today.     Advanced directives: Advance directive discussed with you today. Even though you declined this today please call our office should you change your mind and we can give you the proper paperwork for you to fill out.  Conditions/risks identified: Fall risk prevention  Next appointment: 11/04/17 with Dr Sherrie Mustache.   Preventive Care 42 Years and Older, Male Preventive care refers to lifestyle choices and visits with your health care provider that can promote health and wellness. What does preventive care include?  A yearly physical exam. This is also called an annual well check.  Dental exams once or twice a year.  Routine eye exams. Ask your health care provider how often you should have your eyes checked.  Personal lifestyle choices, including:  Daily care of your teeth and gums.  Regular physical activity.  Eating a healthy diet.  Avoiding tobacco and drug use.  Limiting alcohol use.  Practicing safe sex.  Taking low doses of aspirin every day.  Taking vitamin and mineral supplements as recommended by your health care provider. What happens during an annual well check? The services and screenings done by your health care provider during your annual well check will depend on your age, overall health, lifestyle risk factors, and family history of disease. Counseling  Your  health care provider may ask you questions about your:  Alcohol use.  Tobacco use.  Drug use.  Emotional well-being.  Home and relationship well-being.  Sexual activity.  Eating habits.  History of falls.  Memory and ability to understand (cognition).  Work and work Astronomer. Screening  You may have the following tests or measurements:  Height, weight, and BMI.  Blood pressure.  Lipid and cholesterol levels. These may be checked every 5 years, or more frequently if you are over 9 years old.  Skin check.  Lung cancer screening. You may have this screening every year starting at age 34 if you have a 30-pack-year history of smoking and currently smoke or have quit within the past 15 years.  Fecal occult blood test (FOBT) of the stool. You may have this test every year starting at age 65.  Flexible sigmoidoscopy or colonoscopy. You may have a sigmoidoscopy every 5 years or a colonoscopy every 10 years starting at age 60.  Prostate cancer screening. Recommendations will vary depending on your family history and other risks.  Hepatitis C blood test.  Hepatitis B blood test.  Sexually transmitted disease (STD) testing.  Diabetes screening. This is done by checking your blood sugar (glucose) after you have not eaten for a while (fasting). You may have this done every 1-3 years.  Abdominal aortic aneurysm (AAA) screening. You may need this if you are a current or former smoker.  Osteoporosis. You may be screened starting at age 85 if you are at high risk. Talk with your health care provider about your test results, treatment options, and if necessary, the need for more tests. Vaccines  Your health  care provider may recommend certain vaccines, such as:  Influenza vaccine. This is recommended every year.  Tetanus, diphtheria, and acellular pertussis (Tdap, Td) vaccine. You may need a Td booster every 10 years.  Zoster vaccine. You may need this after age  81.  Pneumococcal 13-valent conjugate (PCV13) vaccine. One dose is recommended after age 1.  Pneumococcal polysaccharide (PPSV23) vaccine. One dose is recommended after age 45. Talk to your health care provider about which screenings and vaccines you need and how often you need them. This information is not intended to replace advice given to you by your health care provider. Make sure you discuss any questions you have with your health care provider. Document Released: 01/19/2015 Document Revised: 09/12/2015 Document Reviewed: 10/24/2014 Elsevier Interactive Patient Education  2017 [place] Prevention in the Home Falls can cause injuries. They can happen to people of all ages. There are many things you can do to make your home safe and to help prevent falls. What can I do on the outside of my home?  Regularly fix the edges of walkways and driveways and fix any cracks.  Remove anything that might make you trip as you walk through a door, such as a raised step or threshold.  Trim any bushes or trees on the path to your home.  Use bright outdoor lighting.  Clear any walking paths of anything that might make someone trip, such as rocks or tools.  Regularly check to see if handrails are loose or broken. Make sure that both sides of any steps have handrails.  Any raised decks and porches should have guardrails on the edges.  Have any leaves, snow, or ice cleared regularly.  Use sand or salt on walking paths during winter.  Clean up any spills in your garage right away. This includes oil or grease spills. What can I do in the bathroom?  Use night lights.  Install grab bars by the toilet and in the tub and shower. Do not use towel bars as grab bars.  Use non-skid mats or decals in the tub or shower.  If you need to sit down in the shower, use a plastic, non-slip stool.  Keep the floor dry. Clean up any water that spills on the floor as soon as it happens.  Remove  soap buildup in the tub or shower regularly.  Attach bath mats securely with double-sided non-slip rug tape.  Do not have throw rugs and other things on the floor that can make you trip. What can I do in the bedroom?  Use night lights.  Make sure that you have a light by your bed that is easy to reach.  Do not use any sheets or blankets that are too big for your bed. They should not hang down onto the floor.  Have a firm chair that has side arms. You can use this for support while you get dressed.  Do not have throw rugs and other things on the floor that can make you trip. What can I do in the kitchen?  Clean up any spills right away.  Avoid walking on wet floors.  Keep items that you use a lot in easy-to-reach places.  If you need to reach something above you, use a strong step stool that has a grab bar.  Keep electrical cords out of the way.  Do not use floor polish or wax that makes floors slippery. If you must use wax, use non-skid floor wax.  Do not have  throw rugs and other things on the floor that can make you trip. What can I do with my stairs?  Do not leave any items on the stairs.  Make sure that there are handrails on both sides of the stairs and use them. Fix handrails that are broken or loose. Make sure that handrails are as long as the stairways.  Check any carpeting to make sure that it is firmly attached to the stairs. Fix any carpet that is loose or worn.  Avoid having throw rugs at the top or bottom of the stairs. If you do have throw rugs, attach them to the floor with carpet tape.  Make sure that you have a light switch at the top of the stairs and the bottom of the stairs. If you do not have them, ask someone to add them for you. What else can I do to help prevent falls?  Wear shoes that:  Do not have high heels.  Have rubber bottoms.  Are comfortable and fit you well.  Are closed at the toe. Do not wear sandals.  If you use a  stepladder:  Make sure that it is fully opened. Do not climb a closed stepladder.  Make sure that both sides of the stepladder are locked into place.  Ask someone to hold it for you, if possible.  Clearly mark and make sure that you can see:  Any grab bars or handrails.  First and last steps.  Where the edge of each step is.  Use tools that help you move around (mobility aids) if they are needed. These include:  Canes.  Walkers.  Scooters.  Crutches.  Turn on the lights when you go into a dark area. Replace any light bulbs as soon as they burn out.  Set up your furniture so you have a clear path. Avoid moving your furniture around.  If any of your floors are uneven, fix them.  If there are any pets around you, be aware of where they are.  Review your medicines with your doctor. Some medicines can make you feel dizzy. This can increase your chance of falling. Ask your doctor what other things that you can do to help prevent falls. This information is not intended to replace advice given to you by your health care provider. Make sure you discuss any questions you have with your health care provider. Document Released: 10/19/2008 Document Revised: 05/31/2015 Document Reviewed: 01/27/2014 Elsevier Interactive Patient Education  2017 [employer].

## 2017-10-22 NOTE — Progress Notes (Signed)
Subjective:   Ernest Lewis is a 76 y.o. male who presents for an Initial Medicare Annual Wellness Visit.  Review of Systems  N/A   Cardiac Risk Factors include: advanced age (>77men, >73 women);male gender;diabetes mellitus;hypertension    Objective:    Today's Vitals   10/22/17 1454  Height: 5\' 8"  (1.727 m)   Body mass index is 27.98 kg/m.  Advanced Directives 10/22/2017 11/03/2016  Does Patient Have a Medical Advance Directive? No No  Would patient like information on creating a medical advance directive? No - Patient declined No - Patient declined    Current Medications (verified) Outpatient Encounter Medications as of 10/22/2017  Medication Sig  . amLODipine (NORVASC) 5 MG tablet \  . aspirin EC 81 MG tablet Take 81 mg by mouth daily.  Marland Kitchen lisinopril (PRINIVIL,ZESTRIL) 10 MG tablet Take 1 tablet (10 mg total) by mouth daily.  Marland Kitchen omeprazole (PRILOSEC) 40 MG capsule Take 1 capsule (40 mg total) by mouth daily. For stomach  . pravastatin (PRAVACHOL) 80 MG tablet Take 1 tablet (80 mg total) by mouth daily.  . tamsulosin (FLOMAX) 0.4 MG CAPS capsule TAKE 1 CAPSULE(0.4 MG) BY MOUTH AT BEDTIME   No facility-administered encounter medications on file as of 10/22/2017.     Allergies (verified) Patient has no known allergies.   History: Past Medical History:  Diagnosis Date  . 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 Breasts asymmetrical   . Carotid artery stenosis 09/15/2008   Carotid dopplers 2010: +50% stenosis bilaterally. s/p vascular consult: recommend carotid dopplers every six months. Repeat carotid dopplers 08/2010: 50-69% stenosis R.  . Diabetes mellitus without complication (HCC) 08/31/2008   with Renal Complications  . Gynecomastia   . Hypercholesteremia 03/09/2007  . Hypertension 03/09/2007  . Hypogonadism, male    Past Surgical History:  Procedure Laterality Date  . PROSTATE SURGERY     Evelene Croon   Family History  Problem Relation Age of Onset  . Hypertension  Father   . CVA Father   . Alcohol abuse Brother   . Diabetes Brother    Social History   Socioeconomic History  . Marital status: Single    Spouse name: Not on file  . Number of children: 2  . Years of education: Not on file  . Highest education level: 2nd grade  Occupational History  . Occupation: employed    Comment: full time  Social Needs  . Financial resource strain: Not hard at all  . Food insecurity:    Worry: Never true    Inability: Never true  . Transportation needs:    Medical: No    Non-medical: No  Tobacco Use  . Smoking status: Never Smoker  . Smokeless tobacco: Never Used  Substance and Sexual Activity  . 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  . Drug use: No  . Sexual activity: Not on file  Lifestyle  . Physical activity:    Days per week: Not on file    Minutes per session: Not on file  . Stress: Only a little  Relationships  . Social connections:    Talks on phone: Patient refused    Gets together: Patient refused    Attends religious service: Patient refused    Active member of club or organization: Patient refused    Attends meetings of clubs or organizations: Patient refused    Relationship status: Patient refused  Other Topics Concern  . Not on file  Social History Narrative  .  Not on file   Tobacco Counseling Counseling given: Not Answered   Clinical Intake:  Pre-visit preparation completed: Yes  Pain : No/denies pain    Nutrition Risk Assessment:  Has the patient had any N/V/D within the last 2 months?  No  Does the patient have any non-healing wounds? No Has the patient had any unintentional weight loss or weight gain?  No   Diabetes:  Is the patient diabetic?  Yes  If diabetic, was a CBG obtained today?  No  Did the patient bring in their glucometer from home?  No  How often do you monitor your CBG's? Does not check BS regularly.   Financial Strains and Diabetes Management:  Are  you having any financial strains with the device, your supplies or your medication? No .  Does the patient Lewis to be seen by Chronic Care Management for management of their diabetes?  No  Would the patient like to be referred to a Nutritionist or for Diabetic Management?  No   Diabetic Exams:  Diabetic Eye Exam:  Overdue for diabetic eye exam. Pt has been advised about the importance in completing this exam. Referral sent today.   Diabetic Foot Exam: Completed 12/05/16.    How often do you need to have someone help you when you read instructions, pamphlets, or other written materials from your doctor or pharmacy?: 5 - Always  Interpreter Needed?: No  Information entered by :: [facility], LPN  Activities of Daily Living In your present state of health, do you have any difficulty performing the following activities: 10/22/2017 12/05/2016  Hearing? N N  Vision? Y Y  Comment No vision in right eye, due for eye exam. Referral sent today.  -  Difficulty concentrating or making decisions? N Y  Walking or climbing stairs? N N  Dressing or bathing? N N  Doing errands, shopping? N N  Preparing Food and eating ? N -  Using the Toilet? N -  In the past six months, have you accidently leaked urine? N -  Do you have problems with loss of bowel control? N -  Managing your Medications? N -  Managing your Finances? N -  Housekeeping or managing your Housekeeping? N -  Some recent data might be hidden     Immunizations and Health Maintenance Immunization History  Administered Date(s) Administered  . Influenza, High Dose Seasonal PF 12/05/2016, 10/22/2017  . Pneumococcal Conjugate-13 12/05/2016   Health Maintenance Due  Topic Date Due  . OPHTHALMOLOGY EXAM  04/18/1950  . TETANUS/TDAP  04/18/1957    Patient Care Team: Malva Limes, MD as PCP - General (Family Medicine)  Indicate any recent Medical Services you may have received from other than [facility] providers in the past year (date  may be approximate).    Assessment:   This is a routine wellness examination for Ernest Lewis.  Hearing/Vision screen No exam data present  Dietary issues and exercise activities discussed: Current Exercise Habits: The patient does not participate in regular exercise at present, Exercise limited by: Other - see comments(works full time and stays busy)  Goals    . Prevent falls     Recommend to remove any items from the home that may cause slips or trips.          Depression Screen PHQ 2/9 Scores 10/22/2017 12/05/2016 07/14/2014  PHQ - 2 Score 0 0 0  PHQ- 9 Score - 0 -    Fall Risk Fall Risk  10/22/2017 12/05/2016 07/14/2014  Falls  in the past year? Yes Yes No  Number falls in past yr: 1 1 -  Injury with Fall? No No -  Follow up Falls prevention discussed;Falls evaluation completed - -    FALL RISK PREVENTION PERTAINING TO THE HOME:  Any stairs in or around the home WITH handrails? No  Home free of loose throw rugs in walkways, pet beds, electrical cords, etc? Yes  Adequate lighting in your home to reduce risk of falls? Yes   ASSISTIVE DEVICES UTILIZED TO PREVENT FALLS:  Life alert? No  Use of a cane, walker or w/c? No  Grab bars in the bathroom? No  Shower chair or bench in shower? No  Elevated toilet seat or a handicapped toilet? No   DME ORDERS:  DME order needed?  No   TIMED UP AND GO:  Was the test performed? No .    Cognitive Function:        Screening Tests Health Maintenance  Topic Date Due  . OPHTHALMOLOGY EXAM  04/18/1950  . TETANUS/TDAP  04/18/1957  . FOOT EXAM  12/05/2017  . PNA vac Low Risk Adult (2 of 2 - PPSV23) 12/05/2017  . HEMOGLOBIN A1C  03/06/2018  . INFLUENZA VACCINE  Completed    Qualifies for Shingles Vaccine? Yes . Due for Shingrix. Education has been provided regarding the importance of this vaccine. Pt has been advised to call insurance company to determine out of pocket expense. Advised may also receive vaccine at local pharmacy  or Health Dept. Verbalized acceptance and understanding.  Tdap: Although this vaccine is not a covered service during a Wellness Exam, does the patient still wish to receive this vaccine today?  No .  Education has been provided regarding the importance of this vaccine. Advised may receive this vaccine at local pharmacy or Health Dept. Aware to provide a copy of the vaccination record if obtained from local pharmacy or Health Dept. Verbalized acceptance and understanding.  Flu Vaccine: Due for Flu vaccine. Does the patient Lewis to receive this vaccine today?  Yes .   Pneumococcal Vaccine: Not due for Pneumovax 23 until 12/05/17.  Cancer Screenings:  Colorectal Screening: No longer required.  Lung Cancer Screening: (Low Dose CT Chest recommended if Age 62-80 years, 30 pack-year currently smoking OR have quit w/in 15years.) does not qualify.    Additional Screening:  Hepatitis C Screening: N/A  Dental Screening: Recommended annual dental exams for proper oral hygiene  Community Resource Referral:  CRR required this visit?  No        Plan:  I have personally reviewed and addressed the Medicare Annual Wellness questionnaire and have noted the following in the patient's chart:  A. Medical and social history B. Use of alcohol, tobacco or illicit drugs  C. Current medications and supplements D. Functional ability and status E.  Nutritional status F.  Physical activity G. Advance directives H. List of other physicians I.  Hospitalizations, surgeries, and ER visits in previous 12 months J.  Vitals K. Screenings such as hearing and vision if needed, cognitive and depression L. Referrals and appointments - none  In addition, I have reviewed and discussed with patient certain preventive protocols, quality metrics, and best practice recommendations. A written personalized care plan for preventive services as well as general preventive health recommendations were provided to  patient.  See attached scanned questionnaire for additional information.   Signed,  Hyacinth Meeker, LPN Nurse Health Advisor   Nurse Recommendations: Referral for eye exam sent today. Pt declined  the tetanus vaccine today.

## 2017-11-04 ENCOUNTER — Ambulatory Visit: Payer: Self-pay | Admitting: Family Medicine

## 2018-05-22 ENCOUNTER — Other Ambulatory Visit: Payer: Self-pay | Admitting: Family Medicine

## 2018-05-22 DIAGNOSIS — N400 Enlarged prostate without lower urinary tract symptoms: Secondary | ICD-10-CM

## 2018-05-23 ENCOUNTER — Other Ambulatory Visit: Payer: Self-pay | Admitting: Family Medicine

## 2018-05-23 DIAGNOSIS — I10 Essential (primary) hypertension: Secondary | ICD-10-CM

## 2018-05-23 DIAGNOSIS — E7800 Pure hypercholesterolemia, unspecified: Secondary | ICD-10-CM

## 2018-10-21 NOTE — Progress Notes (Signed)
This encounter was created in error - please disregard.

## 2018-10-25 ENCOUNTER — Other Ambulatory Visit: Payer: Self-pay

## 2018-10-25 ENCOUNTER — Telehealth: Payer: Self-pay

## 2018-10-25 NOTE — Telephone Encounter (Signed)
Called pt to complete his AWV telephonically and pt declined stating he was unable to do so at the current time. Pt requested a CB next week to r/s this apt. Note made.

## 2018-11-23 NOTE — Telephone Encounter (Signed)
Spoke with daughter who states she will have pt CB to schedule his AWV and he needs to schedule a follow up with PCP. Direct number given to have pt call.

## 2018-12-16 NOTE — Telephone Encounter (Signed)
No CB- closing encounter.

## 2019-01-10 DIAGNOSIS — H2513 Age-related nuclear cataract, bilateral: Secondary | ICD-10-CM | POA: Diagnosis not present

## 2019-01-13 DIAGNOSIS — H401133 Primary open-angle glaucoma, bilateral, severe stage: Secondary | ICD-10-CM | POA: Diagnosis not present

## 2019-01-18 DIAGNOSIS — H401133 Primary open-angle glaucoma, bilateral, severe stage: Secondary | ICD-10-CM | POA: Diagnosis not present

## 2019-01-26 DIAGNOSIS — H1811 Bullous keratopathy, right eye: Secondary | ICD-10-CM | POA: Diagnosis not present

## 2019-02-02 DIAGNOSIS — H1811 Bullous keratopathy, right eye: Secondary | ICD-10-CM | POA: Diagnosis not present

## 2019-02-11 DIAGNOSIS — H401133 Primary open-angle glaucoma, bilateral, severe stage: Secondary | ICD-10-CM | POA: Diagnosis not present

## 2019-04-15 DIAGNOSIS — H401133 Primary open-angle glaucoma, bilateral, severe stage: Secondary | ICD-10-CM | POA: Diagnosis not present

## 2019-04-21 IMAGING — CT CT HEAD W/O CM
4 of 8 series · 14 of 47 positions shown, 15 images · non-contrast
Comparison: CT head and cervical spine dated November 06, 2012.

CLINICAL DATA: Syncope with fall.

EXAM:
CT HEAD WITHOUT CONTRAST
CT CERVICAL SPINE WITHOUT CONTRAST
TECHNIQUE: Multidetector CT imaging of the head and cervical spine was
performed following the standard protocol without intravenous
contrast. Multiplanar CT image reconstructions of the cervical spine
were also generated.

[Series 2: head wo · axial · 0.44mm/px · z∈[+204,+334]mm · 3 of 27 slices shown, 4 images]
[im 1/27  brain]
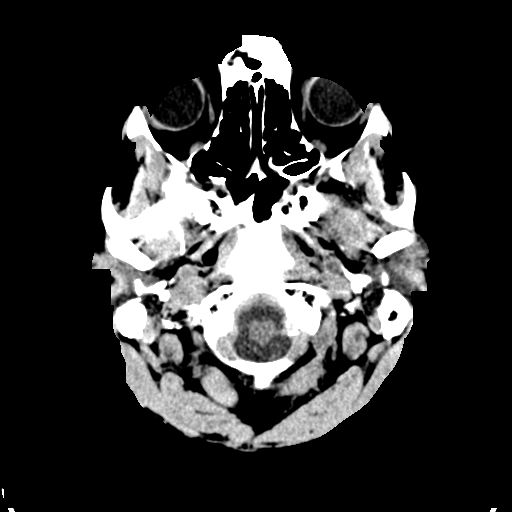
[im 1/27  bone]
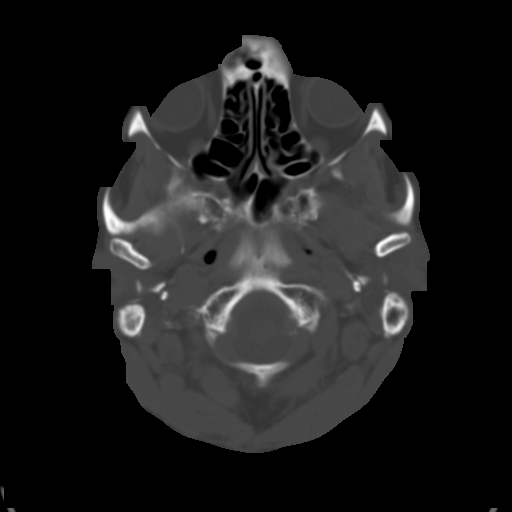
[im 14/27  brain]
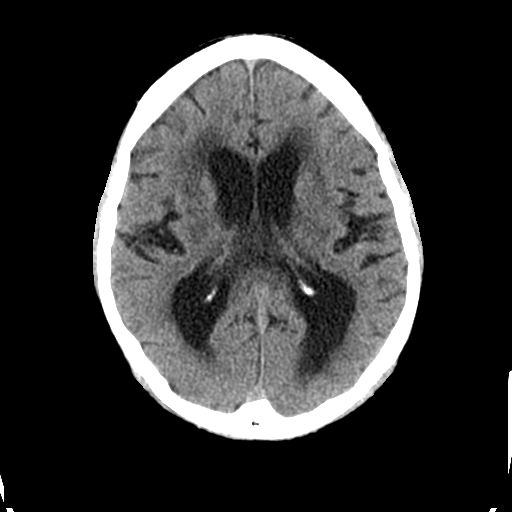
[im 27/27  brain]
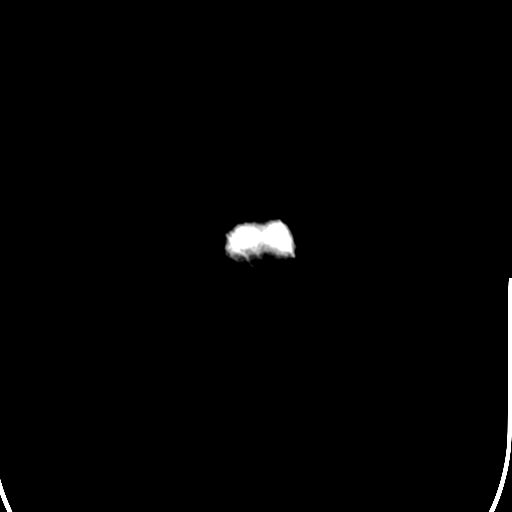

[Series 4: coronal soft tissue · coronal · 0.28mm/px · 3 of 67 slices shown]
[im 12/67  brain]
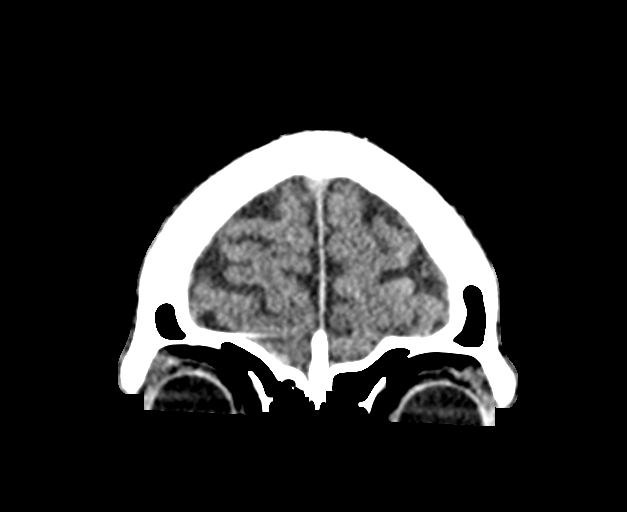
[im 16/67  brain]
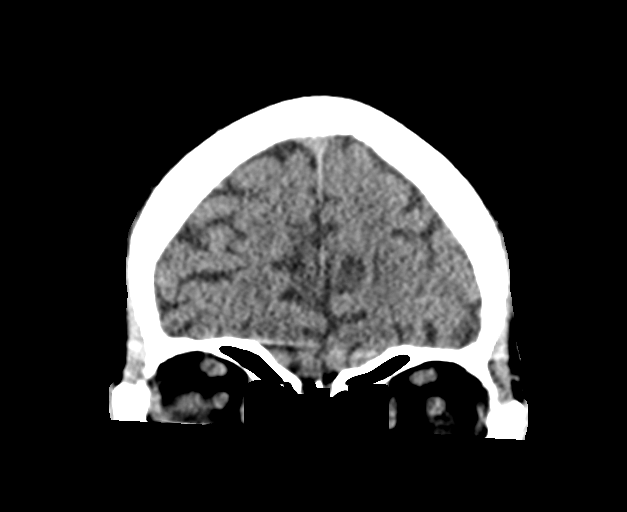
[im 20/67  brain]
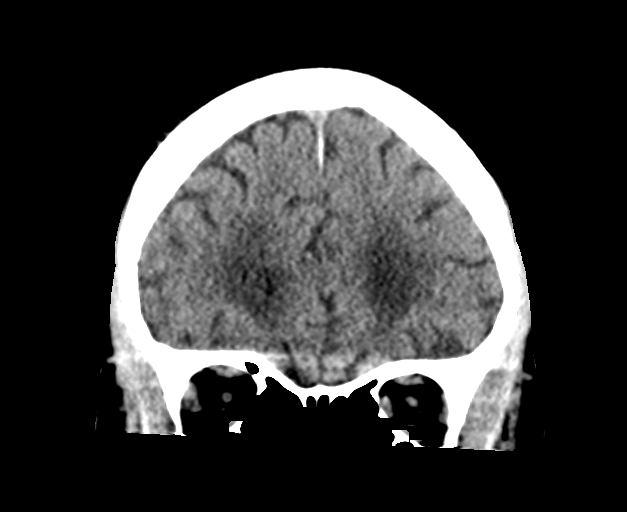

[Series 5: sagittal soft tissue · sagittal · 0.28mm/px · 2 of 56 slices shown]
[im 19/56  brain]
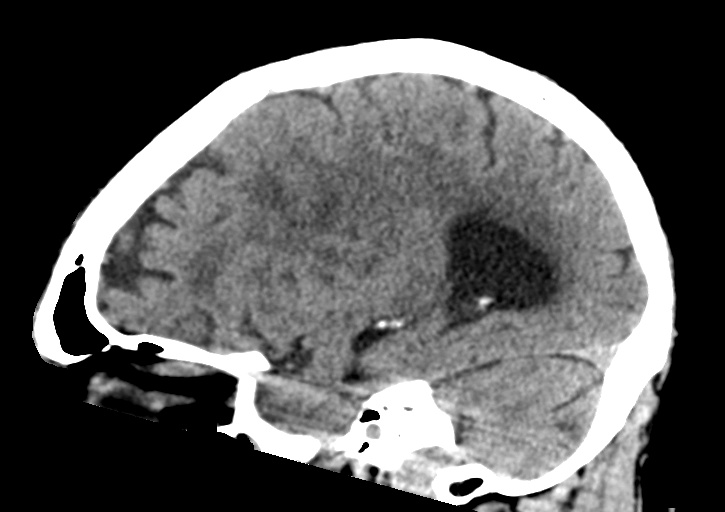
[im 37/56  brain]
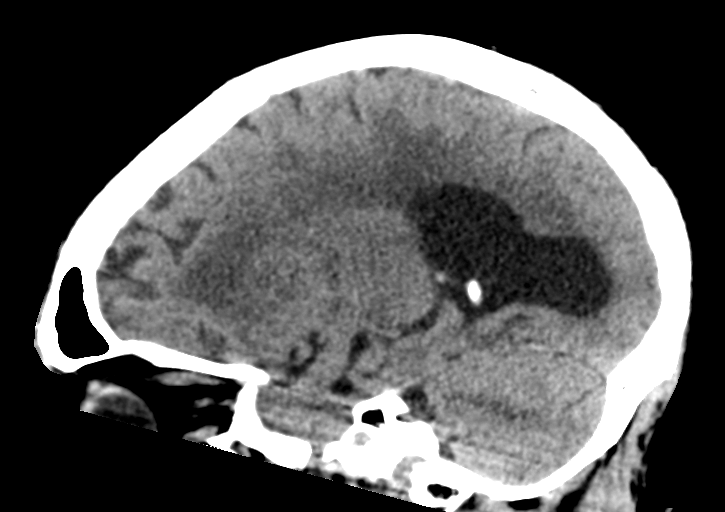

[Series 12: orthogonal bone · axial · 0.21mm/px · z∈[+6,+138]mm · 6 of 129 slices shown]
[im 12/129  bone]
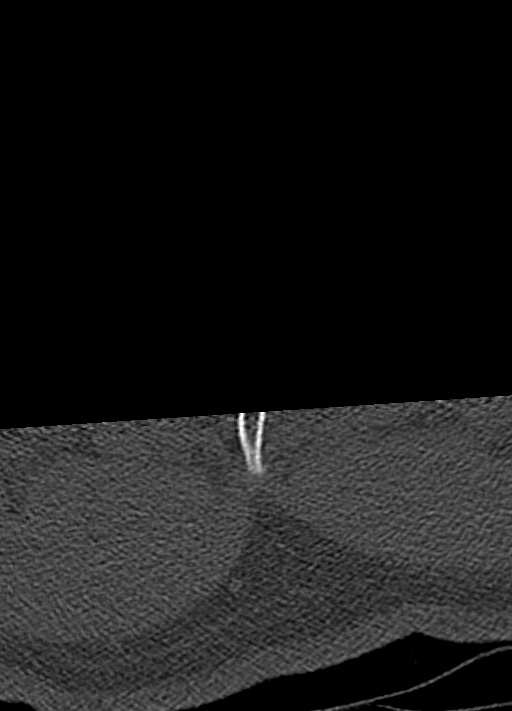
[im 24/129  bone]
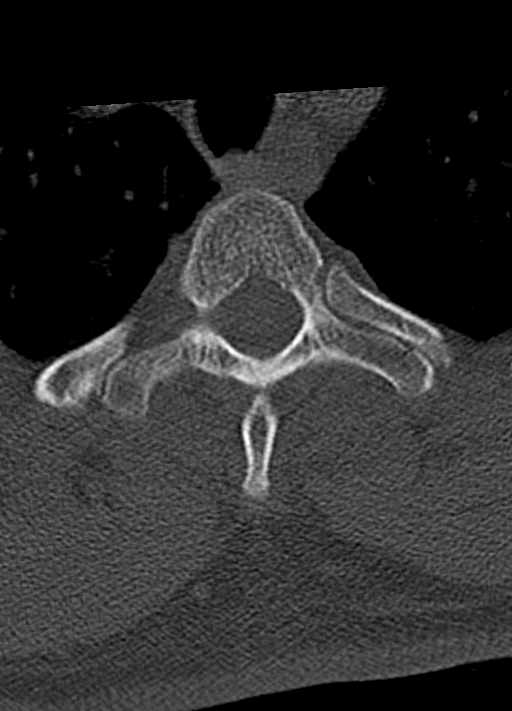
[im 47/129  bone]
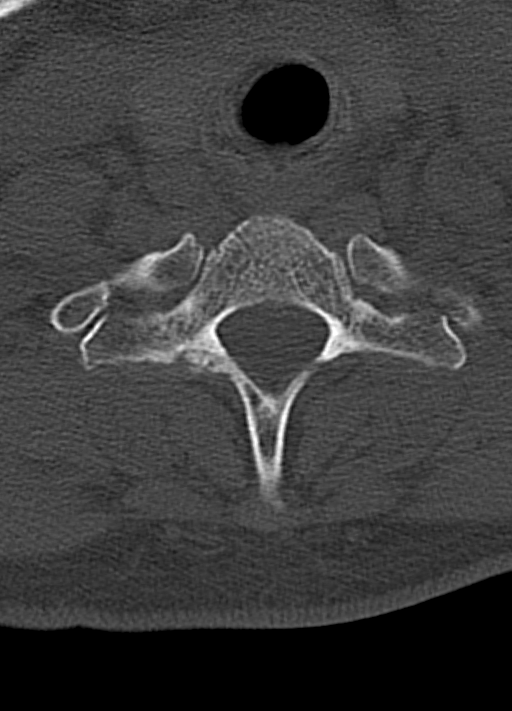
[im 59/129  bone]
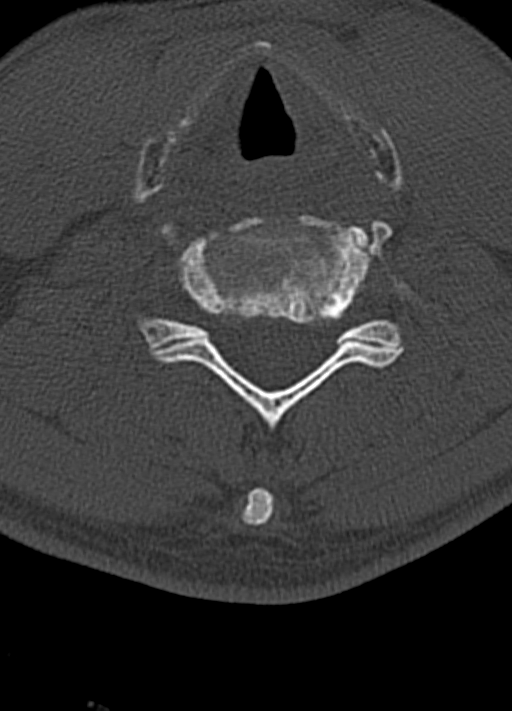
[im 70/129  bone]
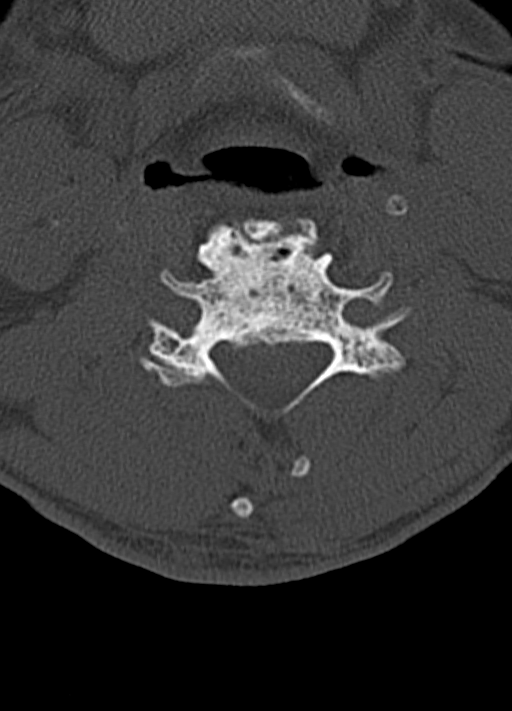
[im 82/129  bone]
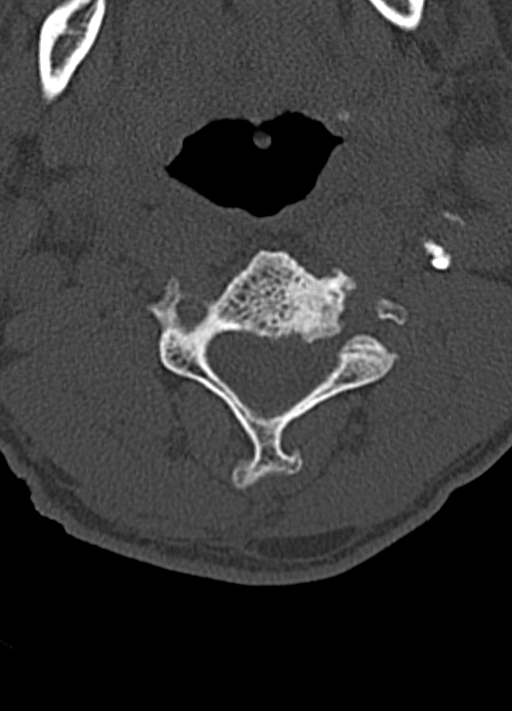

[14 of 47 positions shown; findings below may reference images not displayed]

FINDINGS: CT HEAD FINDINGS

Brain: No evidence of acute infarction, hemorrhage, hydrocephalus,
extra-axial collection or mass lesion/mass effect. Stable age
related cerebral atrophy with ex vacuo dilatation of the ventricles.
Stable periventricular and subcortical white matter hypodensities.

Vascular: Atherosclerotic vascular calcification of the carotid
siphons. No hyperdense vessel.

Skull: Normal. Negative for fracture or focal lesion.

Sinuses/Orbits: No acute finding.

Other: None.

CT CERVICAL SPINE FINDINGS

Alignment: Reversal of the normal cervical lordosis, centered at C4,
unchanged. No traumatic malalignment.

Skull base and vertebrae: No acute fracture. No primary bone lesion
or focal pathologic process.

Soft tissues and spinal canal: No prevertebral fluid or swelling. No
visible canal hematoma.

Disc levels: Severe degenerative disc disease from C3-C4 through
C5-C6 with prominent vertebral body sclerosis and cystic change,
similar to prior study. Mild multilevel neuroforaminal stenoses due
to facet uncovertebral hypertrophy.

Upper chest: Negative

Other: None.
IMPRESSION: 1. No acute intracranial abnormality. Stable cerebral atrophy and
chronic microvascular ischemic white matter disease.
2. No acute cervical spine fracture. Stable severe degenerative disc
disease from C3-C4 through C5-C6.

## 2019-08-15 ENCOUNTER — Other Ambulatory Visit: Payer: Self-pay | Admitting: Family Medicine

## 2019-08-15 DIAGNOSIS — E7800 Pure hypercholesterolemia, unspecified: Secondary | ICD-10-CM

## 2019-08-15 DIAGNOSIS — N400 Enlarged prostate without lower urinary tract symptoms: Secondary | ICD-10-CM

## 2019-08-15 DIAGNOSIS — I10 Essential (primary) hypertension: Secondary | ICD-10-CM

## 2019-08-15 NOTE — Telephone Encounter (Signed)
Attempted to contact patient, no answer or voicemail. Patient needs a follow up appointment. Okay for PEC to advise patient & schedule.

## 2019-08-15 NOTE — Telephone Encounter (Signed)
Requested medication (s) are due for refill today: no  Requested medication (s) are on the active medication list: yes  Last refill:  08/20/2018  Future visit scheduled: no  Notes to clinic:  Patient is overdue for a follow up    Requested Prescriptions  Pending Prescriptions Disp Refills   pravastatin (PRAVACHOL) 80 MG tablet [Pharmacy Med Name: PRAVASTATIN 80MG  TABLETS] 90 tablet 1    Sig: TAKE 1 TABLET(80 MG) BY MOUTH DAILY      Cardiovascular:  Antilipid - Statins Failed - 08/15/2019  7:09 AM      Failed - Total Cholesterol in normal range and within 360 days    Cholesterol  Date Value Ref Range Status  12/05/2016 169 <200 mg/dL Final  12/07/2016 95/09/3265 0 - 200 mg/dL Final          Failed - LDL in normal range and within 360 days    Ldl Cholesterol, Calc  Date Value Ref Range Status  04/21/2013 107 (H) 0 - 100 mg/dL Final   LDL Cholesterol (Calc)  Date Value Ref Range Status  12/05/2016 93 mg/dL (calc) Final    Comment:    Reference range: <100 . Desirable range <100 mg/dL for primary prevention;   <70 mg/dL for patients with CHD or diabetic patients  with > or = 2 CHD risk factors. 12/07/2016 LDL-C is now calculated using the Martin-Hopkins  calculation, which is a validated novel method providing  better accuracy than the Friedewald equation in the  estimation of LDL-C.  Marland Kitchen et al. Horald Pollen. Lenox Ahr): 2061-2068  (http://education.[employer].com/faq/FAQ164)    LDL Direct  Date Value Ref Range Status  04/17/2017 77 0 - 99 mg/dL Final          Failed - HDL in normal range and within 360 days    HDL Cholesterol  Date Value Ref Range Status  04/21/2013 39 (L) 40 - 60 mg/dL Final   HDL  Date Value Ref Range Status  12/05/2016 53 >40 mg/dL Final          Failed - Triglycerides in normal range and within 360 days    Triglycerides  Date Value Ref Range Status  12/05/2016 129 <150 mg/dL Final  12/07/2016 90 0 - 200 mg/dL Final          Failed - Valid  encounter within last 12 months    Recent Outpatient Visits           1 year ago Type 2 diabetes mellitus without complication, without long-term current use of insulin (HCC)   [facility], [place]   2 years ago Type 2 diabetes mellitus without complication, without long-term current use of insulin (HCC)   [facility], MD   2 years ago Type 2 diabetes mellitus with stage 2 chronic kidney disease, without long-term current use of insulin (HCC)   [facility], MD   4 years ago Type 2 diabetes mellitus with stage 2 chronic kidney disease, without long-term current use of insulin (HCC)   [facility], MD   5 years ago Carotid artery narrowing, bilateral   [facility], MD              Passed - Patient is not pregnant        lisinopril (ZESTRIL) 10 MG tablet [Pharmacy Med Name: LISINOPRIL 10MG  TABLETS] 90 tablet 1    Sig: TAKE 1 TABLET(10 MG)  BY MOUTH DAILY      Cardiovascular:  ACE Inhibitors Failed - 08/15/2019  7:09 AM      Failed - Cr in normal range and within 180 days    Creat  Date Value Ref Range Status  12/05/2016 1.55 (H) 0.70 - 1.18 mg/dL Final    Comment:    For patients >52 years of age, the reference limit for Creatinine is approximately 13% higher for people identified as African-American. .    Creatinine, Ser  Date Value Ref Range Status  04/17/2017 1.41 (H) 0.76 - 1.27 mg/dL Final   Creatinine, POC  Date Value Ref Range Status  04/17/2017 n/a mg/dL Final          Failed - K in normal range and within 180 days    Potassium  Date Value Ref Range Status  04/17/2017 3.9 3.5 - 5.2 mmol/L Final  04/21/2013 3.9 3.5 - 5.1 mmol/L Final          Failed - Last BP in normal range    BP Readings from Last 1 Encounters:  10/22/17 (!) 184/72          Failed - Valid encounter within last 6 months    Recent  Outpatient Visits           1 year ago Type 2 diabetes mellitus without complication, without long-term current use of insulin (HCC)   [facility] Malva Limes, MD   2 years ago Type 2 diabetes mellitus without complication, without long-term current use of insulin (HCC)   [facility] Malva Limes, MD   2 years ago Type 2 diabetes mellitus with stage 2 chronic kidney disease, without long-term current use of insulin (HCC)   [facility] Malva Limes, MD   4 years ago Type 2 diabetes mellitus with stage 2 chronic kidney disease, without long-term current use of insulin (HCC)   [facility] Malva Limes, MD   5 years ago Carotid artery narrowing, bilateral   [facility] Malva Limes, MD              Passed - Patient is not pregnant        omeprazole (PRILOSEC) 40 MG capsule [Pharmacy Med Name: OMEPRAZOLE 40MG  CAPSULES] 90 capsule 1    Sig: TAKE 1 CAPSULE(40 MG) BY MOUTH DAILY FOR STOMACH      Gastroenterology: Proton Pump Inhibitors Failed - 08/15/2019  7:09 AM      Failed - Valid encounter within last 12 months    Recent Outpatient Visits           1 year ago Type 2 diabetes mellitus without complication, without long-term current use of insulin (HCC)   [facility], [place]   2 years ago Type 2 diabetes mellitus without complication, without long-term current use of insulin (HCC)   [facility], MD   2 years ago Type 2 diabetes mellitus with stage 2 chronic kidney disease, without long-term current use of insulin (HCC)   [facility], MD   4 years ago Type 2 diabetes mellitus with stage 2 chronic kidney disease, without long-term current use of insulin [facility])   [facility], MD   5 years ago Carotid artery narrowing, bilateral   [facility], MD  tamsulosin (FLOMAX) 0.4 MG CAPS capsule [Pharmacy Med Name: TAMSULOSIN 0.4MG  CAPSULES] 90 capsule 4    Sig: TAKE 1 CAPSULE(0.4 MG) BY MOUTH AT BEDTIME      Urology: Alpha-Adrenergic Blocker Failed - 08/15/2019  7:09 AM      Failed - Last BP in normal range    BP Readings from Last 1 Encounters:  10/22/17 (!) 184/72          Failed - Valid encounter within last 12 months    Recent Outpatient Visits           1 year ago Type 2 diabetes mellitus without complication, without long-term current use of insulin (HCC)   [facility] Malva Limes, MD   2 years ago Type 2 diabetes mellitus without complication, without long-term current use of insulin (HCC)   [facility] Malva Limes, MD   2 years ago Type 2 diabetes mellitus with stage 2 chronic kidney disease, without long-term current use of insulin (HCC)   [facility] Malva Limes, MD   4 years ago Type 2 diabetes mellitus with stage 2 chronic kidney disease, without long-term current use of insulin [facility])   [facility] Malva Limes, MD   5 years ago Carotid artery narrowing, bilateral   [facility] Malva Limes, MD

## 2019-09-26 ENCOUNTER — Emergency Department
Admission: EM | Admit: 2019-09-26 | Discharge: 2019-09-26 | Disposition: A | Discharge status: Home / Self Care | Payer: Medicare HMO | Attending: Emergency Medicine | Admitting: Emergency Medicine

## 2019-09-26 ENCOUNTER — Other Ambulatory Visit: Payer: Self-pay

## 2019-09-26 DIAGNOSIS — I129 Hypertensive chronic kidney disease with stage 1 through stage 4 chronic kidney disease, or unspecified chronic kidney disease: Secondary | ICD-10-CM | POA: Diagnosis not present

## 2019-09-26 DIAGNOSIS — N182 Chronic kidney disease, stage 2 (mild): Secondary | ICD-10-CM | POA: Insufficient documentation

## 2019-09-26 DIAGNOSIS — R55 Syncope and collapse: Secondary | ICD-10-CM | POA: Diagnosis not present

## 2019-09-26 DIAGNOSIS — Z79899 Other long term (current) drug therapy: Secondary | ICD-10-CM | POA: Diagnosis not present

## 2019-09-26 DIAGNOSIS — E1122 Type 2 diabetes mellitus with diabetic chronic kidney disease: Secondary | ICD-10-CM | POA: Diagnosis not present

## 2019-09-26 DIAGNOSIS — Z7982 Long term (current) use of aspirin: Secondary | ICD-10-CM | POA: Insufficient documentation

## 2019-09-26 LAB — BASIC METABOLIC PANEL
Anion gap: 10 (ref 5–15)
BUN: 14 mg/dL (ref 8–23)
CO2: 28 mmol/L (ref 22–32)
Calcium: 9.3 mg/dL (ref 8.9–10.3)
Chloride: 104 mmol/L (ref 98–111)
Creatinine, Ser: 1.57 mg/dL — ABNORMAL HIGH (ref 0.61–1.24)
GFR calc Af Amer: 47 mL/min — ABNORMAL LOW (ref >60)
GFR calc non Af Amer: 41 mL/min — ABNORMAL LOW (ref >60)
Glucose, Bld: 78 mg/dL (ref 70–99)
Potassium: 3.6 mmol/L (ref 3.5–5.1)
Sodium: 142 mmol/L (ref 135–145)

## 2019-09-26 LAB — CBC
HCT: 39.7 % (ref 39.0–52.0)
Hemoglobin: 12.9 g/dL — ABNORMAL LOW (ref 13.0–17.0)
MCH: 26.7 pg (ref 26.0–34.0)
MCHC: 32.5 g/dL (ref 30.0–36.0)
MCV: 82.2 fL (ref 80.0–100.0)
Platelets: 159 10*3/uL (ref 150–400)
RBC: 4.83 MIL/uL (ref 4.22–5.81)
RDW: 13.9 % (ref 11.5–15.5)
WBC: 6.7 10*3/uL (ref 4.0–10.5)
nRBC: 0 % (ref 0.0–0.2)

## 2019-09-26 NOTE — ED Triage Notes (Signed)
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 Denies any pain .

## 2019-09-26 NOTE — ED Provider Notes (Signed)
[facility] Emergency Department Provider Note   ____________________________________________   First MD Initiated Contact with Patient 09/26/19 1643     (approximate)  I have reviewed the triage vital signs and the nursing notes.   HISTORY  Chief Complaint Loss of Consciousness    HPI Ernest Lewis is a 79 y.o. male with a past medical history of hypercholesterolemia, hypertension, and alcohol abuse who presents for an episode of presyncope that occurred while he was at work today.  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  Patient then took a break and sat down and states that he became acutely lightheaded and it lasted for approximately 1 hour.  Patient states that attempting to get up worsened the symptoms and they were partially relieved when laying flat.  Patient endorses poor p.o. intake.  Patient denies any associated chest pain, shortness of breath, vision changes, tinnitus, or weakness/numbness/tingling in any extremity         Past Medical History:  Diagnosis Date  . 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 Breasts asymmetrical   . Carotid artery stenosis 09/15/2008   Carotid dopplers 2010: +50% stenosis bilaterally. s/p vascular consult: recommend carotid dopplers every six months. Repeat carotid dopplers 08/2010: 50-69% stenosis R.  . Diabetes mellitus without complication (HCC) 08/31/2008   with Renal Complications  . Gynecomastia   . Hypercholesteremia 03/09/2007  . Hypertension 03/09/2007  . Hypogonadism, male     Patient Active Problem List   Diagnosis Date Noted  . Gynecomastia 07/07/2014  . 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 Frank hematuria 07/07/2014  . 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 Bleeding gastrointestinal 11/04/2012  . Carotid artery narrowing 09/15/2008  . Diabetes (HCC) 08/31/2008  . 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 Elevated PSA  03/25/2007  . BPH (benign prostatic hyperplasia) 03/09/2007  . Erectile dysfunction 03/09/2007  . GERD (gastroesophageal reflux disease) 03/09/2007  . Hypercholesteremia 03/09/2007  . Hypertension 03/09/2007    Past Surgical History:  Procedure Laterality Date  . PROSTATE SURGERY     Evelene Croon    Prior to Admission medications   Medication Sig Start Date End Date Taking? Authorizing Provider  amLODipine (NORVASC) 5 MG tablet \ 09/04/17   Malva Limes, MD  aspirin EC 81 MG tablet Take 81 mg by mouth daily.    [provider]  lisinopril (ZESTRIL) 10 MG tablet TAKE 1 TABLET(10 MG) BY MOUTH DAILY 05/23/18   Malva Limes, MD  omeprazole (PRILOSEC) 40 MG capsule TAKE 1 CAPSULE(40 MG) BY MOUTH DAILY FOR STOMACH 05/23/18   Malva Limes, MD  pravastatin (PRAVACHOL) 80 MG tablet TAKE 1 TABLET(80 MG) BY MOUTH DAILY 05/23/18   Malva Limes, MD  tamsulosin (FLOMAX) 0.4 MG CAPS capsule TAKE 1 CAPSULE(0.4 MG) BY MOUTH AT BEDTIME 05/22/18   Malva Limes, MD    Allergies Patient has no known allergies.  Family History  Problem Relation Age of Onset  . Hypertension Father   . CVA Father   . Alcohol abuse Brother   . Diabetes Brother     Social History Social History   Tobacco Use  . Smoking status: Never Smoker  . Smokeless tobacco: Never Used  Vaping Use  . Vaping Use: Never used  Substance Use Topics  . 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  . Drug use: No  Review of Systems Constitutional: No fever/chills Eyes: No visual changes. ENT: No sore throat. Cardiovascular: Denies chest pain. Respiratory: Denies shortness of breath. Gastrointestinal: No abdominal pain.  No nausea, no vomiting.  No diarrhea. Genitourinary: Negative for dysuria. Musculoskeletal: Negative for acute arthralgias Skin: Negative for rash. Neurological: Negative for headaches, weakness/numbness/paresthesias in any extremity Psychiatric: Negative  for suicidal ideation/homicidal ideation   ____________________________________________   PHYSICAL EXAM:  VITAL SIGNS: ED Triage Vitals  Enc Vitals Group     BP 09/26/19 1410 (!) 166/103     Pulse Rate 09/26/19 1410 89     Resp 09/26/19 1410 18     Temp 09/26/19 1410 98 F (36.7 C)     Temp Source 09/26/19 1410 Oral     SpO2 09/26/19 1410 98 %     Weight 09/26/19 1411 185 lb (83.9 kg)     Height 09/26/19 1411 5\' 8"  (1.727 m)     Head Circumference --      Peak Flow --      Pain Score 09/26/19 1411 0     Pain Loc --      Pain Edu? --      Excl. in GC? --    Constitutional: Alert and oriented. Well appearing and in no acute distress. Eyes: Conjunctivae are normal. PERRL. EOMI. Head: Atraumatic. Nose: No congestion/rhinnorhea. Mouth/Throat: Mucous membranes are moist.  Poor dentition Neck: No stridor Cardiovascular: Normal rate, regular rhythm. Grossly normal heart sounds.  Good peripheral circulation. Respiratory: Normal respiratory effort.  No retractions. Gastrointestinal: Soft and nontender. No distention. Musculoskeletal: No lower extremity tenderness nor edema.  No joint effusions. Neurologic:  Normal speech and language. No gross focal neurologic deficits are appreciated. Skin:  Skin is warm and dry. No rash noted. Psychiatric: Mood and affect are normal. Speech and behavior are normal.  ____________________________________________   LABS (all labs ordered are listed, but only abnormal results are displayed)  Labs Reviewed  BASIC METABOLIC PANEL - Abnormal; Notable for the following components:      Result Value   Creatinine, Ser 1.57 (*)    GFR calc non Af Amer 41 (*)    GFR calc Af Amer 47 (*)    All other components within normal limits  CBC - Abnormal; Notable for the following components:   Hemoglobin 12.9 (*)    All other components within normal limits  URINALYSIS, COMPLETE (UACMP) WITH MICROSCOPIC  CBG MONITORING, ED    ____________________________________________  EKG  ED ECG REPORT I, 09/28/19, the attending physician, personally viewed and interpreted this ECG.  Date: 09/26/2019 EKG Time: 1410 Rate: 71 Rhythm: normal sinus rhythm QRS Axis: normal Intervals: normal ST/T Wave abnormalities: normal Narrative Interpretation: no evidence of acute ischemia.  PVCs _____________________________________   PROCEDURES  Procedure(s) performed (including Critical Care):  Procedures   ____________________________________________   INITIAL IMPRESSION / ASSESSMENT AND PLAN / ED COURSE        Patient presents with complaints of syncope/presyncope ED Workup:  CBC, BMP, Troponin, BNP, ECG Differential diagnosis includes HF, ICH, seizure, stroke, HOCM, ACS, aortic dissection, malignant arrhythmia, or GI bleed. Findings: No evidence of acute laboratory abnormalities.  Troponin negative x1 EKG: No e/o STEMI. No evidence of Brugadas sign, delta wave, epsilon wave, significantly prolonged QTc, or malignant arrhythmia.  Disposition: Discharge. Patient is at baseline at this time. Return precautions expressed and understood in person. Advised follow up with primary care provider or clinic physician in next 24 hours.      ____________________________________________  FINAL CLINICAL IMPRESSION(S) / ED DIAGNOSES  Final diagnoses:  Near syncope     ED Discharge Orders    None       Note:  This document was prepared using Dragon voice recognition software and may include unintentional dictation errors.   Merwyn Katos, MD 09/26/19 [phone]

## 2019-09-28 ENCOUNTER — Other Ambulatory Visit: Payer: Self-pay | Admitting: Family Medicine

## 2019-09-28 DIAGNOSIS — I10 Essential (primary) hypertension: Secondary | ICD-10-CM

## 2019-10-10 ENCOUNTER — Other Ambulatory Visit: Payer: Self-pay

## 2019-10-10 ENCOUNTER — Encounter: Payer: Self-pay | Admitting: Family Medicine

## 2019-10-10 ENCOUNTER — Ambulatory Visit (INDEPENDENT_AMBULATORY_CARE_PROVIDER_SITE_OTHER): Payer: Medicare HMO | Admitting: Family Medicine

## 2019-10-10 VITALS — Temp 102.1°F

## 2019-10-10 DIAGNOSIS — R509 Fever, unspecified: Secondary | ICD-10-CM | POA: Diagnosis not present

## 2019-10-10 DIAGNOSIS — R630 Anorexia: Secondary | ICD-10-CM

## 2019-10-10 DIAGNOSIS — R55 Syncope and collapse: Secondary | ICD-10-CM

## 2019-10-10 DIAGNOSIS — R410 Disorientation, unspecified: Secondary | ICD-10-CM

## 2019-10-10 MED ORDER — AZITHROMYCIN 250 MG PO TABS: ORAL_TABLET | ORAL | 0 refills | Status: DC

## 2019-10-10 NOTE — Progress Notes (Signed)
MyChart Video Visit    Virtual Visit via Video Note   This visit type was conducted due to national recommendations for restrictions regarding the COVID-19 Pandemic (e.g. social distancing) in an effort to limit this patient's exposure and mitigate transmission in our community. This patient is at least at moderate risk for complications without adequate follow up. This format is felt to be most appropriate for this patient at this time. Physical exam was limited by quality of the video and audio technology used for the visit.   Patient location: car Provider location: bfp  I discussed the limitations of evaluation and management by telemedicine and the availability of in person appointments. The patient expressed understanding and agreed to proceed.  Patient: Ernest Lewis   DOB: [birth year]   79 y.o. Male  MRN: [id number] Visit Date: 10/10/2019  Today's healthcare provider: Mila Merry, MD   Chief Complaint  Patient presents with  . Follow-up   Subjective    HPI  Follow up ER visit  Patient was seen in ER for an episode of presyncope that occurred while he was at work on 09/26/2019. He was treated for near syncope.     Labs were ordered showing no evidence of acute laboratory abnormalities.  Troponin negative x1 EKG: No e/o STEMI. No evidence of Brugadas sign, delta wave, epsilon wave, significantly prolonged QTc, or malignant arrhythmia. Patient was discharged home and advised to follow up with PCP.  Today patient came into the office with his daughter and was found to have a fever of 102.1 (oral). Appointment was changed to virtual visit at that time. Patients daughter reports he has been more confused lately. He has not taken any of his medications. She states he is not eating and hasn't been himself. She reports that patient has been forgetful and often repeats himself  He has been working since ER visit up to three days ago.    -----------------------------------------------------------------------------------------    Medications: Outpatient Medications Prior to Visit  Medication Sig  . amLODipine (NORVASC) 5 MG tablet \ (Patient not taking: Reported on 10/10/2019)  . aspirin EC 81 MG tablet Take 81 mg by mouth daily. (Patient not taking: Reported on 10/10/2019)  . lisinopril (ZESTRIL) 10 MG tablet TAKE 1 TABLET(10 MG) BY MOUTH DAILY (Patient not taking: Reported on 10/10/2019)  . omeprazole (PRILOSEC) 40 MG capsule TAKE 1 CAPSULE(40 MG) BY MOUTH DAILY FOR STOMACH (Patient not taking: Reported on 10/10/2019)  . pravastatin (PRAVACHOL) 80 MG tablet TAKE 1 TABLET(80 MG) BY MOUTH DAILY (Patient not taking: Reported on 10/10/2019)  . tamsulosin (FLOMAX) 0.4 MG CAPS capsule TAKE 1 CAPSULE(0.4 MG) BY MOUTH AT BEDTIME (Patient not taking: Reported on 10/10/2019)   No facility-administered medications prior to visit.    Review of Systems  Constitutional: Positive for fatigue. Negative for appetite change, chills and fever.  Respiratory: Negative for chest tightness, shortness of breath and wheezing.   Cardiovascular: Negative for chest pain and palpitations.  Gastrointestinal: Negative for abdominal pain, nausea and vomiting.  Genitourinary: Negative for difficulty urinating, dysuria and frequency.  Neurological: Positive for tremors.  Psychiatric/Behavioral: Positive for confusion.      Objective    Temp (!) 102.1 F (38.9 C) (Oral)   Physical Exam   Awake, alert,oriented x 2.    Assessment & Plan     1. Pre-syncope Single episode with negative ER work up 2 weeks ago.   2. Confusion Slowly progressively several weeks. Consider head CT. Need to rule out Covid  due to fever.   3. Fever, unspecified fever cause  - Novel Coronavirus, NAA ([facility])  No localizing symptoms will cover with azithromycin for now.   4. Loss of appetite Onset today, probably related to fever. He specifically denies nausea,  vomiting and abdominal pain.       Video connection was lost when more than 50% of the duration of the visit was complete, at which time the remainder of the visit was completed via audio only.  I discussed the assessment and treatment plan with the patient. The patient was provided an opportunity to ask questions and all were answered. The patient agreed with the plan and demonstrated an understanding of the instructions.   The patient was advised to call back or seek an in-person evaluation if the symptoms worsen or if the condition fails to improve as anticipated.  I provided 13 minutes of non-face-to-face time during this encounter.  The entirety of the information documented in the History of Present Illness, Review of Systems and Physical Exam were personally obtained by me. Portions of this information were initially documented by the CMA and reviewed by me for thoroughness and accuracy.     Mila Merry, MD [facility] [phone] (phone) [phone] (fax)  [facility]

## 2019-10-12 ENCOUNTER — Other Ambulatory Visit: Payer: Self-pay

## 2019-10-12 ENCOUNTER — Inpatient Hospital Stay
Admission: EM | Admit: 2019-10-12 | Discharge: 2019-10-19 | DRG: 177 | Disposition: A | Discharge status: Skilled Nursing Facility | Payer: Medicare HMO | Attending: Family Medicine | Admitting: Family Medicine

## 2019-10-12 ENCOUNTER — Other Ambulatory Visit: Payer: Self-pay | Admitting: Nurse Practitioner

## 2019-10-12 ENCOUNTER — Telehealth: Payer: Self-pay | Admitting: Nurse Practitioner

## 2019-10-12 ENCOUNTER — Telehealth: Payer: Self-pay

## 2019-10-12 DIAGNOSIS — U071 COVID-19: Secondary | ICD-10-CM

## 2019-10-12 DIAGNOSIS — Z79899 Other long term (current) drug therapy: Secondary | ICD-10-CM | POA: Diagnosis not present

## 2019-10-12 DIAGNOSIS — E7800 Pure hypercholesterolemia, unspecified: Secondary | ICD-10-CM | POA: Diagnosis present

## 2019-10-12 DIAGNOSIS — Z823 Family history of stroke: Secondary | ICD-10-CM

## 2019-10-12 DIAGNOSIS — K5900 Constipation, unspecified: Secondary | ICD-10-CM | POA: Diagnosis not present

## 2019-10-12 DIAGNOSIS — E1122 Type 2 diabetes mellitus with diabetic chronic kidney disease: Secondary | ICD-10-CM

## 2019-10-12 DIAGNOSIS — R5381 Other malaise: Secondary | ICD-10-CM | POA: Diagnosis not present

## 2019-10-12 DIAGNOSIS — N1831 Chronic kidney disease, stage 3a: Secondary | ICD-10-CM | POA: Diagnosis present

## 2019-10-12 DIAGNOSIS — N390 Urinary tract infection, site not specified: Secondary | ICD-10-CM | POA: Diagnosis present

## 2019-10-12 DIAGNOSIS — G9341 Metabolic encephalopathy: Secondary | ICD-10-CM | POA: Diagnosis not present

## 2019-10-12 DIAGNOSIS — F05 Delirium due to known physiological condition: Secondary | ICD-10-CM | POA: Diagnosis present

## 2019-10-12 DIAGNOSIS — Z7189 Other specified counseling: Secondary | ICD-10-CM | POA: Diagnosis not present

## 2019-10-12 DIAGNOSIS — Z833 Family history of diabetes mellitus: Secondary | ICD-10-CM | POA: Diagnosis not present

## 2019-10-12 DIAGNOSIS — I129 Hypertensive chronic kidney disease with stage 1 through stage 4 chronic kidney disease, or unspecified chronic kidney disease: Secondary | ICD-10-CM | POA: Diagnosis present

## 2019-10-12 DIAGNOSIS — Z9114 Patient's other noncompliance with medication regimen: Secondary | ICD-10-CM | POA: Diagnosis not present

## 2019-10-12 DIAGNOSIS — I10 Essential (primary) hypertension: Secondary | ICD-10-CM

## 2019-10-12 DIAGNOSIS — R451 Restlessness and agitation: Secondary | ICD-10-CM | POA: Diagnosis not present

## 2019-10-12 DIAGNOSIS — E860 Dehydration: Secondary | ICD-10-CM | POA: Diagnosis present

## 2019-10-12 DIAGNOSIS — E861 Hypovolemia: Secondary | ICD-10-CM | POA: Diagnosis present

## 2019-10-12 DIAGNOSIS — N400 Enlarged prostate without lower urinary tract symptoms: Secondary | ICD-10-CM | POA: Diagnosis present

## 2019-10-12 DIAGNOSIS — I6523 Occlusion and stenosis of bilateral carotid arteries: Secondary | ICD-10-CM

## 2019-10-12 DIAGNOSIS — N179 Acute kidney failure, unspecified: Secondary | ICD-10-CM | POA: Diagnosis present

## 2019-10-12 DIAGNOSIS — R1312 Dysphagia, oropharyngeal phase: Secondary | ICD-10-CM | POA: Diagnosis not present

## 2019-10-12 DIAGNOSIS — R4182 Altered mental status, unspecified: Secondary | ICD-10-CM | POA: Diagnosis not present

## 2019-10-12 DIAGNOSIS — Z8249 Family history of ischemic heart disease and other diseases of the circulatory system: Secondary | ICD-10-CM | POA: Diagnosis not present

## 2019-10-12 DIAGNOSIS — M2550 Pain in unspecified joint: Secondary | ICD-10-CM | POA: Diagnosis not present

## 2019-10-12 DIAGNOSIS — R4189 Other symptoms and signs involving cognitive functions and awareness: Secondary | ICD-10-CM

## 2019-10-12 DIAGNOSIS — E43 Unspecified severe protein-calorie malnutrition: Secondary | ICD-10-CM | POA: Diagnosis present

## 2019-10-12 DIAGNOSIS — K219 Gastro-esophageal reflux disease without esophagitis: Secondary | ICD-10-CM | POA: Diagnosis present

## 2019-10-12 DIAGNOSIS — M6281 Muscle weakness (generalized): Secondary | ICD-10-CM | POA: Diagnosis not present

## 2019-10-12 DIAGNOSIS — R278 Other lack of coordination: Secondary | ICD-10-CM | POA: Diagnosis not present

## 2019-10-12 DIAGNOSIS — R404 Transient alteration of awareness: Secondary | ICD-10-CM | POA: Diagnosis not present

## 2019-10-12 DIAGNOSIS — F0390 Unspecified dementia without behavioral disturbance: Secondary | ICD-10-CM | POA: Diagnosis present

## 2019-10-12 DIAGNOSIS — E870 Hyperosmolality and hypernatremia: Secondary | ICD-10-CM | POA: Diagnosis present

## 2019-10-12 DIAGNOSIS — Z515 Encounter for palliative care: Secondary | ICD-10-CM | POA: Diagnosis not present

## 2019-10-12 DIAGNOSIS — R2681 Unsteadiness on feet: Secondary | ICD-10-CM | POA: Diagnosis not present

## 2019-10-12 DIAGNOSIS — R41841 Cognitive communication deficit: Secondary | ICD-10-CM | POA: Diagnosis not present

## 2019-10-12 DIAGNOSIS — Z7401 Bed confinement status: Secondary | ICD-10-CM | POA: Diagnosis not present

## 2019-10-12 LAB — COMPREHENSIVE METABOLIC PANEL
ALT: 28 U/L (ref 0–44)
AST: 54 U/L — ABNORMAL HIGH (ref 15–41)
Albumin: 3.6 g/dL (ref 3.5–5.0)
Alkaline Phosphatase: 42 U/L (ref 38–126)
Anion gap: 9 (ref 5–15)
BUN: 35 mg/dL — ABNORMAL HIGH (ref 8–23)
CO2: 28 mmol/L (ref 22–32)
Calcium: 8.9 mg/dL (ref 8.9–10.3)
Chloride: 110 mmol/L (ref 98–111)
Creatinine, Ser: 2.26 mg/dL — ABNORMAL HIGH (ref 0.61–1.24)
GFR calc non Af Amer: 26 mL/min — ABNORMAL LOW (ref >60)
Glucose, Bld: 111 mg/dL — ABNORMAL HIGH (ref 70–99)
Potassium: 4 mmol/L (ref 3.5–5.1)
Sodium: 147 mmol/L — ABNORMAL HIGH (ref 135–145)
Total Bilirubin: 0.7 mg/dL (ref 0.3–1.2)
Total Protein: 7.5 g/dL (ref 6.5–8.1)

## 2019-10-12 LAB — GLUCOSE, CAPILLARY: Glucose-Capillary: 104 mg/dL — ABNORMAL HIGH (ref 70–99)

## 2019-10-12 LAB — URINALYSIS, ROUTINE W REFLEX MICROSCOPIC
Bilirubin Urine: NEGATIVE
Glucose, UA: NEGATIVE mg/dL
Ketones, ur: NEGATIVE mg/dL
Leukocytes,Ua: NEGATIVE
Nitrite: NEGATIVE
Protein, ur: 30 mg/dL — AB
Specific Gravity, Urine: 1.013 (ref 1.005–1.030)
Squamous Epithelial / HPF: NONE SEEN (ref 0–5)
pH: 7 (ref 5.0–8.0)

## 2019-10-12 LAB — CBC
HCT: 38.5 % — ABNORMAL LOW (ref 39.0–52.0)
Hemoglobin: 12.8 g/dL — ABNORMAL LOW (ref 13.0–17.0)
MCH: 26.6 pg (ref 26.0–34.0)
MCHC: 33.2 g/dL (ref 30.0–36.0)
MCV: 80 fL (ref 80.0–100.0)
Platelets: 132 10*3/uL — ABNORMAL LOW (ref 150–400)
RBC: 4.81 MIL/uL (ref 4.22–5.81)
RDW: 13.8 % (ref 11.5–15.5)
WBC: 4.8 10*3/uL (ref 4.0–10.5)
nRBC: 0 % (ref 0.0–0.2)

## 2019-10-12 LAB — SARS-COV-2, NAA 2 DAY TAT

## 2019-10-12 LAB — NOVEL CORONAVIRUS, NAA: SARS-CoV-2, NAA: DETECTED — AB

## 2019-10-12 MED ORDER — SODIUM CHLORIDE 0.9 % IV BOLUS
1000.0000 mL | Freq: Once | INTRAVENOUS | Status: AC
  Administered 2019-10-12: 1000 mL via INTRAVENOUS

## 2019-10-12 MED ORDER — SODIUM CHLORIDE 0.9 % IV SOLN
1.0000 g | Freq: Once | INTRAVENOUS | Status: AC
  Administered 2019-10-13: 1 g via INTRAVENOUS
  Filled 2019-10-12: qty 10

## 2019-10-12 MED ORDER — ENOXAPARIN SODIUM 30 MG/0.3ML ~~LOC~~ SOLN: 30.0000 mg | SUBCUTANEOUS | Status: DC

## 2019-10-12 NOTE — ED Triage Notes (Signed)
First Rn note: Pt presents to ED via [facility] with c/o +Covid, per EMS pt's son wanted him sent out due to not eating that started today, per EMS pt mentation at baseline, had all medications this morning.  99.1Axl 144/96 84Hr 99RA CBG 172

## 2019-10-12 NOTE — ED Notes (Signed)
Pt provided food, Cheerios, apple sauce and Saltine crackers.

## 2019-10-12 NOTE — ED Notes (Signed)
Pt's daughter called. [name] [phone], looking for an update.

## 2019-10-12 NOTE — ED Notes (Signed)
On Neuro, pt confused at baseline

## 2019-10-12 NOTE — Telephone Encounter (Signed)
Son calling to get results from covid results.  Right now pt's phone is cut off and you will need to call the son's phone.  [phone]

## 2019-10-12 NOTE — Telephone Encounter (Signed)
Pt's son Ernest Lewis is requesting Covid results. Pt doesn't have a DPR on file. Son request we contact pt at [phone] with results. Please advise. Thanks TNP

## 2019-10-12 NOTE — ED Triage Notes (Signed)
Pt to the er to be evaluated due to not eating breakfast. In triage, pt is not oriented but can answer questions. Pt denies pain or SOB. Pt covid positive.

## 2019-10-12 NOTE — ED Provider Notes (Signed)
[facility] Emergency Department Provider Note   ____________________________________________   I have reviewed the triage vital signs and the nursing notes.   HISTORY  Chief Complaint Covid Positive   History limited by and level 5 caveat due to: Confusion   HPI Ernest Lewis is a 79 y.o. male who presents to the emergency department today because of family concern for decreased oral intake.  The patient was recently diagnosed with Covid.  Apparently the patient has had roughly 2 months of increasing confusion.  They are actually scheduled to see a doctor when he was found to have a fever and test positive for Covid.  Apparently his oral intake has decreased significantly over the past 2 days.  Patient himself cannot give any significant history.  He however denies any pain.   Records reviewed. Per medical record review patient has a history of COVID positive test 2 days ago.   Past Medical History:  Diagnosis Date  . 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 Breasts asymmetrical   . Carotid artery stenosis 09/15/2008   Carotid dopplers 2010: +50% stenosis bilaterally. s/p vascular consult: recommend carotid dopplers every six months. Repeat carotid dopplers 08/2010: 50-69% stenosis R.  . Diabetes mellitus without complication (HCC) 08/31/2008   with Renal Complications  . Gynecomastia   . Hypercholesteremia 03/09/2007  . Hypertension 03/09/2007  . Hypogonadism, male     Patient Active Problem List   Diagnosis Date Noted  . Gynecomastia 07/07/2014  . Chronic kidney disease, stage 3a (HCC) 07/07/2014  . Frank hematuria 07/07/2014  . 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 Bleeding gastrointestinal 11/04/2012  . Carotid artery narrowing 09/15/2008  . Diabetes (HCC) 08/31/2008  . 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 Elevated PSA 03/25/2007  . BPH (benign prostatic hyperplasia) 03/09/2007  . Erectile dysfunction 03/09/2007  . GERD (gastroesophageal reflux disease) 03/09/2007   . Hypercholesteremia 03/09/2007  . Hypertension 03/09/2007    Past Surgical History:  Procedure Laterality Date  . PROSTATE SURGERY     Evelene Croon    Prior to Admission medications   Medication Sig Start Date End Date Taking? Authorizing Provider  amLODipine (NORVASC) 5 MG tablet \ Patient not taking: Reported on 10/10/2019 09/04/17   Malva Limes, MD  aspirin EC 81 MG tablet Take 81 mg by mouth daily. Patient not taking: Reported on 10/10/2019    [provider]  azithromycin (ZITHROMAX) 250 MG tablet 2 by mouth today, then 1 daily for 4 days 10/10/19 10/15/19  Malva Limes, MD  lisinopril (ZESTRIL) 10 MG tablet TAKE 1 TABLET(10 MG) BY MOUTH DAILY Patient not taking: Reported on 10/10/2019 09/29/19   Malva Limes, MD  omeprazole (PRILOSEC) 40 MG capsule TAKE 1 CAPSULE(40 MG) BY MOUTH DAILY FOR STOMACH Patient not taking: Reported on 10/10/2019 05/23/18   Malva Limes, MD  pravastatin (PRAVACHOL) 80 MG tablet TAKE 1 TABLET(80 MG) BY MOUTH DAILY Patient not taking: Reported on 10/10/2019 05/23/18   Malva Limes, MD  tamsulosin (FLOMAX) 0.4 MG CAPS capsule TAKE 1 CAPSULE(0.4 MG) BY MOUTH AT BEDTIME Patient not taking: Reported on 10/10/2019 05/22/18   Malva Limes, MD    Allergies Patient has no known allergies.  Family History  Problem Relation Age of Onset  . Hypertension Father   . CVA Father   . Alcohol abuse Brother   . Diabetes Brother     Social History Social History   Tobacco Use  . Smoking status: Never Smoker  . Smokeless tobacco: Never Used  Vaping Use  . Vaping Use: Never used  Substance Use Topics  . 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  . Drug use: No    Review of Systems Unable to obtain reliable ROS secondary to confusion.  ____________________________________________   PHYSICAL EXAM:  VITAL SIGNS: ED Triage Vitals  Enc Vitals Group     BP 10/12/19 1628 (!) 141/70     Pulse Rate  10/12/19 1628 77     Resp 10/12/19 1628 18     Temp 10/12/19 1628 99.6 F (37.6 C)     Temp Source 10/12/19 1628 Oral     SpO2 10/12/19 1628 97 %     Weight 10/12/19 1634 185 lb (83.9 kg)     Height 10/12/19 1634 5\' 8"  (1.727 m)     Head Circumference --      Peak Flow --      Pain Score 10/12/19 1634 0     Pain Loc --      Pain Edu? --      Excl. in GC? --      Constitutional: Awake and alert. Confused.  Eyes: Conjunctivae are normal.  ENT      Head: Normocephalic and atraumatic.      Nose: No congestion/rhinnorhea.      Mouth/Throat: Mucous membranes are moist.      Neck: No stridor. Hematological/Lymphatic/Immunilogical: No cervical lymphadenopathy. Cardiovascular: Normal rate, regular rhythm.  No murmurs, rubs, or gallops.  Respiratory: Normal respiratory effort without tachypnea nor retractions. Breath sounds are clear and equal bilaterally. No wheezes/rales/rhonchi. Gastrointestinal: Soft and non tender. No rebound. No guarding.  Genitourinary: Deferred Musculoskeletal: Normal range of motion in all extremities. No lower extremity edema. Neurologic:  Not completely oriented. Moving all extremities. Skin:  Skin is warm, dry and intact. No rash noted. Psychiatric: Mood and affect are normal. Speech and behavior are normal. Patient exhibits appropriate insight and judgment.  ____________________________________________    LABS (pertinent positives/negatives)  CMP na 147, k 4.0, glu 111, cr 2.26 CBC wbc 48, hgb 12.8, plt 132 UA hazy, small, wbc 6-10 bacteria many ____________________________________________   EKG  I, 12/12/19, attending physician, personally viewed and interpreted this EKG  EKG Time: 1631 Rate: 73 Rhythm: normal sinus rhythm Axis: normal Intervals: qtc 436 QRS: LVH ST changes: no st elevation Impression: abnormal ekg   ____________________________________________     RADIOLOGY  None  ____________________________________________   PROCEDURES  Procedures  ____________________________________________   INITIAL IMPRESSION / ASSESSMENT AND PLAN / ED COURSE  Pertinent labs & imaging results that were available during my care of the patient were reviewed by me and considered in my medical decision making (see chart for details).   Patient presented to the emergency department today because of concerns for decreased oral intake in the setting of recent Covid diagnosis.  Patient's work-up does show a slight AKI.  Patient was given fluids and we did encourage patient to eat drink however he continued to have low volume oral intake.  Because of this will plan on admission for further hydration work up and management. UA also concerning for possible UTI. Will start antibiotics.    ____________________________________________   FINAL CLINICAL IMPRESSION(S) / ED DIAGNOSES  Final diagnoses:  COVID-19  AKI (acute kidney injury) (HCC)  Lower urinary tract infection     Note: This dictation was prepared with Dragon dictation. Any transcriptional errors that result from this process are unintentional     Phineas Semen,  Dustin Flock, MD 10/12/19 2352

## 2019-10-12 NOTE — Telephone Encounter (Signed)
COVID-19 MAB Treatment Evaluation Qualifiers: Age >/= 25, Diabetes, Cardiovascular Disease, Hypertension and Chronic Kidney Disease Symptom Onset: Unknown Day: Unknown Symptoms Present: Covid Symptoms: Fever and Cough Test:does  have a confirmed positive test result  I attempted to contact the patient, Ernest Lewis via telephone on behalf of the [facility] to discuss how they are feeling since COVID-19 diagnosis, current symptoms, and to determine if the patient qualifies for MAB Infusion as a treatment option.   Based on chart review, it has been determined that the patient is considered at high risk for complications from COVID-19 infection and  does qualify for MAB treatment at this time based on risk factors and/or length of current illness.    I was unable to reach the patient via telephone. Left voicemail with the MAB hotline number and encouraged the patient to call back with any questions or concerns.   The patient will remain qualified up to day 10 from the time of symptom onset and may change their mind about treatment at any time.  If further questions or concerns arise, the patient has been encouraged to contact the MAB Infusion Hotline at [phone] to leave a message for call back from one of our providers.  Shawna Clamp, DNP, AGNP-c COVID Mon[facility] [place]  [facility]

## 2019-10-12 NOTE — Progress Notes (Signed)
I connected by phone with Ernest Lewis on 10/12/2019 at 2:55 PM to discuss the potential use of a new treatment for mild to moderate COVID-19 viral infection in non-hospitalized patients.  This patient is a 79 y.o. male that meets the FDA criteria for Emergency Use Authorization of COVID monoclonal antibody casirivimab/imdevimab or bamlanivimab/eteseviamb.  Has a (+) direct SARS-CoV-2 viral test result  Has mild or moderate COVID-19   Is NOT hospitalized due to COVID-19  Is within 10 days of symptom onset  Has at least one of the high risk factor(s) for progression to severe COVID-19 and/or hospitalization as defined in EUA.  Specific high risk criteria : Hypertension, Diabetes, Older Age, Chronic Kidney Disease, High Risk Population   I have spoken and communicated the following to the patient or parent/caregiver regarding COVID monoclonal antibody treatment:  1. FDA has authorized the emergency use for the treatment of mild to moderate COVID-19 in adults and pediatric patients with positive results of direct SARS-CoV-2 viral testing who are 46 years of age and older weighing at least 40 kg, and who are at high risk for progressing to severe COVID-19 and/or hospitalization.  2. The significant known and potential risks and benefits of COVID monoclonal antibody, and the extent to which such potential risks and benefits are unknown.  3. Information on available alternative treatments and the risks and benefits of those alternatives, including clinical trials.  4. Patients treated with COVID monoclonal antibody should continue to self-isolate and use infection control measures (e.g., wear mask, isolate, social distance, avoid sharing personal items, clean and disinfect "high touch" surfaces, and frequent handwashing) according to CDC guidelines.   5. The patient or parent/caregiver has the option to accept or refuse COVID monoclonal antibody treatment.  After reviewing this information  with the patient, the patient has agreed to receive one of the available covid 19 monoclonal antibodies and will be provided an appropriate fact sheet prior to infusion. Tollie Eth, NP 10/12/2019 2:55 PM

## 2019-10-13 ENCOUNTER — Observation Stay: Payer: Medicare HMO

## 2019-10-13 ENCOUNTER — Ambulatory Visit (HOSPITAL_COMMUNITY): Payer: Medicare HMO | Attending: Pulmonary Disease

## 2019-10-13 DIAGNOSIS — N1831 Chronic kidney disease, stage 3a: Secondary | ICD-10-CM | POA: Diagnosis present

## 2019-10-13 DIAGNOSIS — G9341 Metabolic encephalopathy: Secondary | ICD-10-CM | POA: Diagnosis not present

## 2019-10-13 DIAGNOSIS — E860 Dehydration: Secondary | ICD-10-CM | POA: Diagnosis present

## 2019-10-13 DIAGNOSIS — K5900 Constipation, unspecified: Secondary | ICD-10-CM | POA: Diagnosis not present

## 2019-10-13 DIAGNOSIS — U071 COVID-19: Secondary | ICD-10-CM | POA: Diagnosis present

## 2019-10-13 DIAGNOSIS — R451 Restlessness and agitation: Secondary | ICD-10-CM | POA: Diagnosis not present

## 2019-10-13 DIAGNOSIS — N400 Enlarged prostate without lower urinary tract symptoms: Secondary | ICD-10-CM | POA: Diagnosis present

## 2019-10-13 DIAGNOSIS — I129 Hypertensive chronic kidney disease with stage 1 through stage 4 chronic kidney disease, or unspecified chronic kidney disease: Secondary | ICD-10-CM | POA: Diagnosis present

## 2019-10-13 DIAGNOSIS — E1122 Type 2 diabetes mellitus with diabetic chronic kidney disease: Secondary | ICD-10-CM | POA: Diagnosis present

## 2019-10-13 DIAGNOSIS — E43 Unspecified severe protein-calorie malnutrition: Secondary | ICD-10-CM | POA: Diagnosis present

## 2019-10-13 DIAGNOSIS — N179 Acute kidney failure, unspecified: Secondary | ICD-10-CM | POA: Diagnosis present

## 2019-10-13 DIAGNOSIS — E7800 Pure hypercholesterolemia, unspecified: Secondary | ICD-10-CM | POA: Diagnosis present

## 2019-10-13 DIAGNOSIS — Z79899 Other long term (current) drug therapy: Secondary | ICD-10-CM | POA: Diagnosis not present

## 2019-10-13 DIAGNOSIS — E870 Hyperosmolality and hypernatremia: Secondary | ICD-10-CM | POA: Diagnosis present

## 2019-10-13 DIAGNOSIS — K219 Gastro-esophageal reflux disease without esophagitis: Secondary | ICD-10-CM | POA: Diagnosis present

## 2019-10-13 DIAGNOSIS — R4189 Other symptoms and signs involving cognitive functions and awareness: Secondary | ICD-10-CM | POA: Diagnosis not present

## 2019-10-13 DIAGNOSIS — Z7189 Other specified counseling: Secondary | ICD-10-CM | POA: Diagnosis not present

## 2019-10-13 DIAGNOSIS — Z9114 Patient's other noncompliance with medication regimen: Secondary | ICD-10-CM | POA: Diagnosis not present

## 2019-10-13 DIAGNOSIS — Z8249 Family history of ischemic heart disease and other diseases of the circulatory system: Secondary | ICD-10-CM | POA: Diagnosis not present

## 2019-10-13 DIAGNOSIS — Z833 Family history of diabetes mellitus: Secondary | ICD-10-CM | POA: Diagnosis not present

## 2019-10-13 DIAGNOSIS — F05 Delirium due to known physiological condition: Secondary | ICD-10-CM | POA: Diagnosis present

## 2019-10-13 DIAGNOSIS — Z823 Family history of stroke: Secondary | ICD-10-CM | POA: Diagnosis not present

## 2019-10-13 DIAGNOSIS — N390 Urinary tract infection, site not specified: Secondary | ICD-10-CM | POA: Diagnosis present

## 2019-10-13 DIAGNOSIS — F0390 Unspecified dementia without behavioral disturbance: Secondary | ICD-10-CM | POA: Diagnosis present

## 2019-10-13 DIAGNOSIS — E861 Hypovolemia: Secondary | ICD-10-CM | POA: Diagnosis present

## 2019-10-13 LAB — CBC WITH DIFFERENTIAL/PLATELET
Abs Immature Granulocytes: 0.03 10*3/uL (ref 0.00–0.07)
Basophils Absolute: 0 10*3/uL (ref 0.0–0.1)
Basophils Relative: 0 %
Eosinophils Absolute: 0 10*3/uL (ref 0.0–0.5)
Eosinophils Relative: 0 %
HCT: 41.3 % (ref 39.0–52.0)
Hemoglobin: 13.1 g/dL (ref 13.0–17.0)
Immature Granulocytes: 1 %
Lymphocytes Relative: 12 %
Lymphs Abs: 0.7 10*3/uL (ref 0.7–4.0)
MCH: 26.4 pg (ref 26.0–34.0)
MCHC: 31.7 g/dL (ref 30.0–36.0)
MCV: 83.1 fL (ref 80.0–100.0)
Monocytes Absolute: 0.3 10*3/uL (ref 0.1–1.0)
Monocytes Relative: 4 %
Neutro Abs: 4.9 10*3/uL (ref 1.7–7.7)
Neutrophils Relative %: 83 %
Platelets: 143 10*3/uL — ABNORMAL LOW (ref 150–400)
RBC: 4.97 MIL/uL (ref 4.22–5.81)
RDW: 13.8 % (ref 11.5–15.5)
WBC: 5.9 10*3/uL (ref 4.0–10.5)
nRBC: 0 % (ref 0.0–0.2)

## 2019-10-13 LAB — COMPREHENSIVE METABOLIC PANEL
ALT: 30 U/L (ref 0–44)
AST: 54 U/L — ABNORMAL HIGH (ref 15–41)
Albumin: 3.6 g/dL (ref 3.5–5.0)
Alkaline Phosphatase: 42 U/L (ref 38–126)
Anion gap: 11 (ref 5–15)
BUN: 29 mg/dL — ABNORMAL HIGH (ref 8–23)
CO2: 24 mmol/L (ref 22–32)
Calcium: 8.9 mg/dL (ref 8.9–10.3)
Chloride: 114 mmol/L — ABNORMAL HIGH (ref 98–111)
Creatinine, Ser: 1.9 mg/dL — ABNORMAL HIGH (ref 0.61–1.24)
GFR calc non Af Amer: 32 mL/min — ABNORMAL LOW (ref >60)
Glucose, Bld: 108 mg/dL — ABNORMAL HIGH (ref 70–99)
Potassium: 4.4 mmol/L (ref 3.5–5.1)
Sodium: 149 mmol/L — ABNORMAL HIGH (ref 135–145)
Total Bilirubin: 0.7 mg/dL (ref 0.3–1.2)
Total Protein: 7.3 g/dL (ref 6.5–8.1)

## 2019-10-13 LAB — MAGNESIUM: Magnesium: 2.5 mg/dL — ABNORMAL HIGH (ref 1.7–2.4)

## 2019-10-13 LAB — CBC
HCT: 40 % (ref 39.0–52.0)
Hemoglobin: 12.7 g/dL — ABNORMAL LOW (ref 13.0–17.0)
MCH: 26.2 pg (ref 26.0–34.0)
MCHC: 31.8 g/dL (ref 30.0–36.0)
MCV: 82.6 fL (ref 80.0–100.0)
Platelets: 150 10*3/uL (ref 150–400)
RBC: 4.84 MIL/uL (ref 4.22–5.81)
RDW: 13.6 % (ref 11.5–15.5)
WBC: 7.2 10*3/uL (ref 4.0–10.5)
nRBC: 0 % (ref 0.0–0.2)

## 2019-10-13 LAB — LACTIC ACID, PLASMA
Lactic Acid, Venous: 3 mmol/L (ref 0.5–1.9)
Lactic Acid, Venous: 2 mmol/L (ref 0.5–1.9)

## 2019-10-13 LAB — GLUCOSE, CAPILLARY
Glucose-Capillary: 83 mg/dL (ref 70–99)
Glucose-Capillary: 97 mg/dL (ref 70–99)
Glucose-Capillary: 89 mg/dL (ref 70–99)

## 2019-10-13 LAB — C-REACTIVE PROTEIN: CRP: 6.2 mg/dL — ABNORMAL HIGH (ref <1.0)

## 2019-10-13 LAB — HEMOGLOBIN A1C
Hgb A1c MFr Bld: 6.4 % — ABNORMAL HIGH (ref 4.8–5.6)
Mean Plasma Glucose: 136.98 mg/dL

## 2019-10-13 LAB — PHOSPHORUS: Phosphorus: 2.7 mg/dL (ref 2.5–4.6)

## 2019-10-13 MED ORDER — METHYLPREDNISOLONE SODIUM SUCC 125 MG IJ SOLR: 125.0000 mg | Freq: Once | INTRAMUSCULAR | Status: DC | PRN

## 2019-10-13 MED ORDER — DIPHENHYDRAMINE HCL 50 MG/ML IJ SOLN
50.0000 mg | Freq: Once | INTRAMUSCULAR | Status: AC | PRN
  Administered 2019-10-16: 22:00:00 50 mg via INTRAVENOUS
  Filled 2019-10-13 (×2): qty 1

## 2019-10-13 MED ORDER — EPINEPHRINE 0.3 MG/0.3ML IJ SOAJ
0.3000 mg | Freq: Once | INTRAMUSCULAR | Status: DC | PRN
  Filled 2019-10-13: qty 0.3

## 2019-10-13 MED ORDER — THIAMINE HCL 100 MG/ML IJ SOLN
500.0000 mg | Freq: Three times a day (TID) | INTRAVENOUS | Status: AC
  Administered 2019-10-13 – 2019-10-16 (×8): 500 mg via INTRAVENOUS
  Filled 2019-10-13 (×10): qty 5

## 2019-10-13 MED ORDER — SODIUM CHLORIDE 0.9 % IV SOLN: INTRAVENOUS | Status: DC

## 2019-10-13 MED ORDER — ENOXAPARIN SODIUM 40 MG/0.4ML ~~LOC~~ SOLN
40.0000 mg | SUBCUTANEOUS | Status: DC
  Administered 2019-10-13 – 2019-10-18 (×5): 40 mg via SUBCUTANEOUS
  Filled 2019-10-13 (×5): qty 0.4

## 2019-10-13 MED ORDER — HALOPERIDOL LACTATE 5 MG/ML IJ SOLN
2.0000 mg | Freq: Four times a day (QID) | INTRAMUSCULAR | Status: DC | PRN
  Administered 2019-10-13 – 2019-10-17 (×6): 2 mg via INTRAMUSCULAR
  Filled 2019-10-13 (×8): qty 1

## 2019-10-13 MED ORDER — ACETAMINOPHEN 325 MG PO TABS
650.0000 mg | ORAL_TABLET | Freq: Four times a day (QID) | ORAL | Status: DC | PRN
  Filled 2019-10-13 (×2): qty 2

## 2019-10-13 MED ORDER — SIMETHICONE 80 MG PO CHEW
80.0000 mg | CHEWABLE_TABLET | Freq: Four times a day (QID) | ORAL | Status: DC
  Administered 2019-10-14 – 2019-10-19 (×9): 80 mg via ORAL
  Filled 2019-10-13 (×27): qty 1

## 2019-10-13 MED ORDER — INSULIN ASPART 100 UNIT/ML ~~LOC~~ SOLN
0.0000 [IU] | Freq: Three times a day (TID) | SUBCUTANEOUS | Status: DC
  Administered 2019-10-18 – 2019-10-19 (×2): 2 [IU] via SUBCUTANEOUS
  Filled 2019-10-13 (×2): qty 1

## 2019-10-13 MED ORDER — LORAZEPAM 2 MG/ML IJ SOLN: 1.0000 mg | INTRAMUSCULAR | Status: DC | PRN

## 2019-10-13 MED ORDER — FAMOTIDINE IN NACL 20-0.9 MG/50ML-% IV SOLN
20.0000 mg | Freq: Once | INTRAVENOUS | Status: DC | PRN
  Filled 2019-10-13: qty 50

## 2019-10-13 MED ORDER — INSULIN ASPART 100 UNIT/ML ~~LOC~~ SOLN: 0.0000 [IU] | Freq: Every day | SUBCUTANEOUS | Status: DC

## 2019-10-13 MED ORDER — ALBUTEROL SULFATE HFA 108 (90 BASE) MCG/ACT IN AERS
2.0000 | INHALATION_SPRAY | Freq: Once | RESPIRATORY_TRACT | Status: DC | PRN
  Filled 2019-10-13: qty 6.7

## 2019-10-13 MED ORDER — LORAZEPAM 1 MG PO TABS: 1.0000 mg | ORAL_TABLET | ORAL | Status: DC | PRN

## 2019-10-13 MED ORDER — SODIUM CHLORIDE 0.9 % IV SOLN
1.0000 g | INTRAVENOUS | Status: AC
  Administered 2019-10-13 – 2019-10-17 (×5): 1 g via INTRAVENOUS
  Filled 2019-10-13: qty 10
  Filled 2019-10-13: qty 1
  Filled 2019-10-13: qty 10
  Filled 2019-10-13 (×2): qty 1

## 2019-10-13 MED ORDER — SODIUM CHLORIDE 0.9 % IV SOLN: INTRAVENOUS | Status: AC

## 2019-10-13 MED ORDER — SODIUM CHLORIDE 0.9 % IV SOLN
1200.0000 mg | Freq: Once | INTRAVENOUS | Status: AC
  Administered 2019-10-13: 1200 mg via INTRAVENOUS
  Filled 2019-10-13: qty 10

## 2019-10-13 MED ORDER — THIAMINE HCL 100 MG/ML IJ SOLN: 100.0000 mg | Freq: Every day | INTRAMUSCULAR | Status: DC

## 2019-10-13 MED ORDER — THIAMINE HCL 100 MG PO TABS: 100.0000 mg | ORAL_TABLET | Freq: Every day | ORAL | Status: DC

## 2019-10-13 MED ORDER — MIRTAZAPINE 15 MG PO TABS
15.0000 mg | ORAL_TABLET | Freq: Every day | ORAL | Status: DC
  Administered 2019-10-13 – 2019-10-18 (×5): 15 mg via ORAL
  Filled 2019-10-13 (×5): qty 1

## 2019-10-13 MED ORDER — FOLIC ACID 1 MG PO TABS
1.0000 mg | ORAL_TABLET | Freq: Every day | ORAL | Status: DC
  Administered 2019-10-17 – 2019-10-19 (×3): 1 mg via ORAL
  Filled 2019-10-13 (×4): qty 1

## 2019-10-13 MED ORDER — LORAZEPAM 2 MG/ML IJ SOLN: 1.0000 mg | Freq: Once | INTRAMUSCULAR | Status: DC

## 2019-10-13 MED ORDER — SENNOSIDES-DOCUSATE SODIUM 8.6-50 MG PO TABS: 1.0000 | ORAL_TABLET | Freq: Two times a day (BID) | ORAL | Status: DC | PRN

## 2019-10-13 MED ORDER — HALOPERIDOL LACTATE 5 MG/ML IJ SOLN: 2.0000 mg | Freq: Four times a day (QID) | INTRAMUSCULAR | Status: DC | PRN

## 2019-10-13 MED ORDER — ADULT MULTIVITAMIN W/MINERALS CH
1.0000 | ORAL_TABLET | Freq: Every day | ORAL | Status: DC
  Administered 2019-10-17 – 2019-10-19 (×3): 1 via ORAL
  Filled 2019-10-13 (×4): qty 1

## 2019-10-13 MED ORDER — SODIUM CHLORIDE 0.9 % IV SOLN: INTRAVENOUS | Status: DC | PRN

## 2019-10-13 MED ORDER — AMLODIPINE BESYLATE 10 MG PO TABS
10.0000 mg | ORAL_TABLET | Freq: Every day | ORAL | Status: DC
  Administered 2019-10-13 – 2019-10-19 (×4): 10 mg via ORAL
  Filled 2019-10-13 (×4): qty 1

## 2019-10-13 MED ORDER — ACETAMINOPHEN 10 MG/ML IV SOLN
1000.0000 mg | Freq: Four times a day (QID) | INTRAVENOUS | Status: AC | PRN
  Administered 2019-10-13: 17:00:00 1000 mg via INTRAVENOUS
  Filled 2019-10-13: qty 100

## 2019-10-13 NOTE — ED Notes (Signed)
Pt urinated at bedside. Redirected back to bed

## 2019-10-13 NOTE — Care Management Obs Status (Signed)
MEDICARE OBSERVATION STATUS NOTIFICATION   [name] MRN: [id number] Date of Birth: December 04, 1940   Medicare Observation Status Notification Given:  Yes    Bing Quarry, RN 10/13/2019, 12:25 PM

## 2019-10-13 NOTE — TOC Progression Note (Signed)
Transition of Care [facility]) - Progression Note    [name] MRN: [id number] Date of Birth: 1940/04/02  [facility]) CM/SW Contact  Bing Quarry, RN Phone Number: 10/13/2019, 3:07 PM  Clinical Narrative:    PT rec. SNF for short term rehab. Daughter contacted and is okay with wherever is needed to place patient. Bed search started.     Expected Discharge Plan: Skilled Nursing Facility Barriers to Discharge: Continued Medical Work up  Expected Discharge Plan and Services Expected Discharge Plan: Skilled Nursing Facility   Discharge Planning Services: CM Consult Post Acute Care Choice: Skilled Nursing Facility Living arrangements for the past 2 months: Single Family Home                                       Social Determinants of Health (SDOH) Interventions    Readmission Risk Interventions No flowsheet data found.

## 2019-10-13 NOTE — Progress Notes (Signed)
PHARMACIST - PHYSICIAN COMMUNICATION  CONCERNING:  Enoxaparin (Lovenox) for DVT Prophylaxis    RECOMMENDATION: Patient was prescribed enoxaprin 30mg  q24 hours for VTE prophylaxis.   Filed Weights   10/12/19 1634  Weight: 83.9 kg (185 lb)    Body mass index is 28.13 kg/m.  Estimated Creatinine Clearance: 32.2 mL/min (A) (by C-G formula based on SCr of 1.9 mg/dL (H)).  Patient is candidate for enoxaparin 40mg  every 24 hours based on CrCl >50ml/min  DESCRIPTION: Pharmacy has adjusted enoxaparin dose per [facility] policy.  Patient is now receiving enoxaparin 40mg  every 24 hours   31m, PharmD, BCPS Clinical Pharmacist 10/13/2019 8:39 AM

## 2019-10-13 NOTE — H&P (Addendum)
History and Physical    Ernest Lewis WYO:[id number] DOB: [birth year] DOA: 10/12/2019  PCP: Malva Limes, MD  Patient coming from: Home  I have personally briefly reviewed patient's old medical records in [facility]  Chief Complaint: Decreased appetite  HPI: Ernest Lewis is a 79 y.o. male with medical history significant for type 2 diabetes, hypertension, CKD stage IIIa, history of GI bleed and GERD who presents with concerns of decreased p.o. intake.  Patient only alert and oriented to self and place and was unable to provide any history about why he is here in the ED.  No family at bedside.  History obtained entirely through ED documentation ED physician.  Apparently patient was diagnosed with Covid about 2 days ago and has had significant decreased p.o. intake with fever.  Reportedly patient has also had increased confusion for the past 2 months and family has been awaiting outpatient work-up for this. At bedside, he denies any frank pain.  Denies any chest pain, shortness of breath.  Denies any nausea, vomiting or diarrhea or dysuria.  ED Course: He had a temperature 99.61F, intermittently hypertensive at times and was stable on room air. CBC shows no leukocytosis or leukopenia.  Creatinine notably elevated to 2.26 with BUN of 35 from a prior of 1.57. UA shows many bacteria but was negative for leukocyte and nitrite and patient was started on empiric IV Rocephin by ED physician Dr. Derrill Kay.  Review of Systems:  Unable to fully obtain since patient only alert and oriented to self and place.  However denies any frank pain.  Past Medical History:  Diagnosis Date  . 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 Breasts asymmetrical   . Carotid artery stenosis 09/15/2008   Carotid dopplers 2010: +50% stenosis bilaterally. s/p vascular consult: recommend carotid dopplers every six months. Repeat carotid dopplers 08/2010: 50-69% stenosis R.  . Diabetes mellitus without complication (HCC) 08/31/2008    with Renal Complications  . Gynecomastia   . Hypercholesteremia 03/09/2007  . Hypertension 03/09/2007  . Hypogonadism, male     Past Surgical History:  Procedure Laterality Date  . PROSTATE SURGERY     Evelene Croon     reports that he has never smoked. He has never used smokeless tobacco. He reports previous alcohol use. He reports that he does not use drugs. Social History  No Known Allergies  Family History  Problem Relation Age of Onset  . Hypertension Father   . CVA Father   . Alcohol abuse Brother   . Diabetes Brother      Prior to Admission medications   Medication Sig Start Date End Date Taking? Authorizing Provider  amLODipine (NORVASC) 5 MG tablet \ Patient not taking: Reported on 10/10/2019 09/04/17   Malva Limes, MD  aspirin EC 81 MG tablet Take 81 mg by mouth daily. Patient not taking: Reported on 10/10/2019    [provider]  azithromycin (ZITHROMAX) 250 MG tablet 2 by mouth today, then 1 daily for 4 days 10/10/19 10/15/19  Malva Limes, MD  lisinopril (ZESTRIL) 10 MG tablet TAKE 1 TABLET(10 MG) BY MOUTH DAILY Patient not taking: Reported on 10/10/2019 09/29/19   Malva Limes, MD  omeprazole (PRILOSEC) 40 MG capsule TAKE 1 CAPSULE(40 MG) BY MOUTH DAILY FOR STOMACH Patient not taking: Reported on 10/10/2019 05/23/18   Malva Limes, MD  pravastatin (PRAVACHOL) 80 MG tablet TAKE 1 TABLET(80 MG) BY MOUTH DAILY Patient not taking: Reported on 10/10/2019 05/23/18   Malva Limes,  MD  tamsulosin (FLOMAX) 0.4 MG CAPS capsule TAKE 1 CAPSULE(0.4 MG) BY MOUTH AT BEDTIME Patient not taking: Reported on 10/10/2019 05/22/18   Malva Limes, MD    Physical Exam: Vitals:   10/12/19 1930 10/12/19 2000 10/12/19 2130 10/12/19 2230  BP: (!) 139/122 (!) 209/168 (!) 179/86 (!) 158/83  Pulse:  66    Resp:  16 (!) 23 18  Temp:      TempSrc:      SpO2:  96% 100% 99%  Weight:      Height:        Constitutional: NAD, calm, comfortable, nontoxic appearing  elderly gentleman sitting upright in bed Vitals:   10/12/19 1930 10/12/19 2000 10/12/19 2130 10/12/19 2230  BP: (!) 139/122 (!) 209/168 (!) 179/86 (!) 158/83  Pulse:  66    Resp:  16 (!) 23 18  Temp:      TempSrc:      SpO2:  96% 100% 99%  Weight:      Height:       Eyes: PERRL, lids and conjunctivae normal ENMT: Mucous membranes are moist.  Neck: normal, supple,  Respiratory: clear to auscultation bilaterally, no wheezing, no crackles. Normal respiratory effort. No accessory muscle use.  Cardiovascular: Regular rate and rhythm, no murmurs / rubs / gallops. No extremity edema.   Abdomen: no tenderness, no masses palpated. Bowel sounds positive.  Musculoskeletal: no clubbing / cyanosis. No joint deformity upper and lower extremities. Good ROM, no contractures. Normal muscle tone.  Skin: no rashes, lesions, ulcers. No induration Neurologic: CN 2-12 grossly intact. Sensation intact,  Strength 5/5 in all 4.  Cohesive when communicating.  Able to follow commands.  However only alert and oriented to self and place. Psychiatric: Patient alert and oriented only to self and location.. Able to follow commands.    Labs on Admission: I have personally reviewed following labs and imaging studies  CBC: Recent Labs  Lab 10/12/19 1637  WBC 4.8  HGB 12.8*  HCT 38.5*  MCV 80.0  PLT 132*   Basic Metabolic Panel: Recent Labs  Lab 10/12/19 1637  NA 147*  K 4.0  CL 110  CO2 28  GLUCOSE 111*  BUN 35*  CREATININE 2.26*  CALCIUM 8.9   GFR: Estimated Creatinine Clearance: 27 mL/min (A) (by C-G formula based on SCr of 2.26 mg/dL (H)). Liver Function Tests: Recent Labs  Lab 10/12/19 1637  AST 54*  ALT 28  ALKPHOS 42  BILITOT 0.7  PROT 7.5  ALBUMIN 3.6   No results for input(s): LIPASE, AMYLASE in the last 168 hours. No results for input(s): AMMONIA in the last 168 hours. Coagulation Profile: No results for input(s): INR, PROTIME in the last 168 hours. Cardiac Enzymes: No  results for input(s): CKTOTAL, CKMB, CKMBINDEX, TROPONINI in the last 168 hours. BNP (last 3 results) No results for input(s): PROBNP in the last 8760 hours. HbA1C: No results for input(s): HGBA1C in the last 72 hours. CBG: Recent Labs  Lab 10/12/19 1642  GLUCAP 104*   Lipid Profile: No results for input(s): CHOL, HDL, LDLCALC, TRIG, CHOLHDL, LDLDIRECT in the last 72 hours. Thyroid Function Tests: No results for input(s): TSH, T4TOTAL, FREET4, T3FREE, THYROIDAB in the last 72 hours. Anemia Panel: No results for input(s): VITAMINB12, FOLATE, FERRITIN, TIBC, IRON, RETICCTPCT in the last 72 hours. Urine analysis:    Component Value Date/Time   COLORURINE YELLOW (A) 10/12/2019 2215   APPEARANCEUR HAZY (A) 10/12/2019 2215   APPEARANCEUR Clear 11/06/2012 1330  LABSPEC 1.013 10/12/2019 2215   LABSPEC 1.013 11/06/2012 1330   PHURINE 7.0 10/12/2019 2215   GLUCOSEU NEGATIVE 10/12/2019 2215   GLUCOSEU Negative 11/06/2012 1330   HGBUR SMALL (A) 10/12/2019 2215   BILIRUBINUR NEGATIVE 10/12/2019 2215   BILIRUBINUR Negative 11/06/2012 1330   KETONESUR NEGATIVE 10/12/2019 2215   PROTEINUR 30 (A) 10/12/2019 2215   NITRITE NEGATIVE 10/12/2019 2215   LEUKOCYTESUR NEGATIVE 10/12/2019 2215   LEUKOCYTESUR Negative 11/06/2012 1330    Radiological Exams on Admission: No results found.    Assessment/Plan  AKI secondary to hypovolemia Has received 1 L of normal saline fluid in the ED.  We will continue IV 75 cc/h fluid and follow-up creatinine in the morning. Avoid nephrotoxic agent  ? UTI UA shows many bacteria, few WBC and negative nitrite and leukocyte.  He was started on IV Rocephin in the ED.  We will hold off on any further dose of IV antibiotics while awaiting urine culture  COVID-19 infection No signs of any respiratory distress or significant GI symptoms.   patient agreeable to monoclonal antibody yesterday per PCP documentation but has not yet received- Will start inpatient  given risk factors  Cognitive impairment  Reportedly has been ongoing for several months.  No focal neurological deficits.  Will need to continue to follow-up outpatient.  DVT prophylaxis:.Lovenox Code Status: Full Family Communication: No family at bedside disposition Plan: Home with observation Consults called:  Admission status: Observation  Status is: Observation  The patient remains OBS appropriate and will d/c before 2 midnights.  Dispo: The patient is from: Home              Anticipated d/c is to: Home              Anticipated d/c date is: 1 day              Patient currently is not medically stable to d/c.         Anselm Jungling DO [facility]   If 7PM-7AM, please contact night-coverage [email or web address]   10/13/2019, 1:40 AM

## 2019-10-13 NOTE — Progress Notes (Signed)
Spoke to daughter, [name], by phone this morning to get consent for pt to receive Monoclonal Antibodies. Daughter consented. Both myself and RN Daria Pastures spoke with daughter to verify consent.

## 2019-10-13 NOTE — Evaluation (Signed)
Occupational Therapy Evaluation Patient Details Name: Ernest Lewis MRN: [id number] DOB: [birth year] Today's Date: 10/13/2019    History of Present Illness Pt is an 79 y.o. male presenting to hospital 10/6 with concerns of decreased p.o. intake.  (+) COVID-19.  Per notes pt with functional decline past several months; forgetfulness, poor self care, and medication nonadherence.  Pt admitted with AKI secondary to hypovolemia, ? UTI, COVID-19 infection, and cognitive impairment.  PMH includes alcohol use, carotid artery stenosis, DM, htn, CKD stage 3a, and h/o prostate surgery.   Clinical Impression   Pt was seen for OT evaluation this date. Pt presents today highly agitated, noncommunicative, and unable to provide any information regarding his living situation prior to hospitalization. Currently pt demonstrates impairments as described below (See OT problem list) which functionally limit his ability to perform ADL/self-care tasks. Pt currently requires Mod A for most ADL, with great attention to safety, 2/2 pt's impulsiveness and inability to follow one-step directions. Frequent tactile cues/re-direction is required, pt is non-responsive to verbal cues. Pt would benefit from skilled OT services to address noted impairments and functional limitations (see below for any additional details) in order to maximize safety. It is difficult to make a discharge recommendation with no information regarding pt's prior living situation, supports available, and PLOF. This therapist, however, suspects that pt will need SNF level of care following DC.    Follow Up Recommendations  SNF    Equipment Recommendations  Other (comment) (unclear what equipment pt has available)    Recommendations for Other Services       Precautions / Restrictions Precautions Precautions: Fall Restrictions Weight Bearing Restrictions: No      Mobility Bed Mobility Overal bed mobility: Needs Assistance Bed Mobility: Supine to  Sit;Sit to Supine     Supine to sit: Mod assist Sit to supine: Mod assist   General bed mobility comments: Pt unable to follow 1-step directions (e.g., sit down here), required strong tactile cues for re-direction  Transfers Overall transfer level: Needs assistance Equipment used: None (Pt refused to use RW) Transfers: Sit to/from [employer] Sit to Stand: Mod assist Stand pivot transfers: Mod assist            Balance Overall balance assessment: Needs assistance Sitting-balance support: No upper extremity supported;Feet supported Sitting balance-Leahy Scale: Good Sitting balance - Comments: steady sitting reaching within BOS     Standing balance-Leahy Scale: Fair Standing balance comment: requiring Mod A and tactile cues for dynamic standing activities                           ADL either performed or assessed with clinical judgement   ADL Overall ADL's : Needs assistance/impaired     Grooming: Moderate assistance   Upper Body Bathing: Moderate assistance               Toilet Transfer: Moderate assistance   Toileting- Clothing Manipulation and Hygiene: Min guard       Functional mobility during ADLs: Moderate assistance General ADL Comments: impulsive, unable to follow 1-step directions and VCs     Vision         Perception     Praxis      Pertinent Vitals/Pain Faces Pain Scale: No hurt     Hand Dominance     Extremity/Trunk Assessment Upper Extremity Assessment Upper Extremity Assessment: Generalized weakness   Lower Extremity Assessment Lower Extremity Assessment: Generalized weakness   Cervical / Trunk  Assessment Cervical / Trunk Assessment: Normal   Communication Communication Communication: Expressive difficulties;Receptive difficulties   Cognition Arousal/Alertness: Awake/alert Behavior During Therapy: Agitated;Restless;Impulsive;Anxious Overall Cognitive Status: No family/caregiver present to  determine baseline cognitive functioning                                 General Comments: Oriented to self only   General Comments  Full body tremors, highly agitated, repeatedly pulled off external catheter, constantly attempting to pull out IV    Exercises Other Exercises Other Exercises: Bed mobility, grooming, toileting, cognitive redirection to address agitation and impulsiveness   Shoulder Instructions      Home Living Family/patient expects to be discharged to:: Private residence Living Arrangements: Alone Available Help at Discharge:  (unclear) Type of Home: House Home Access: Stairs to enter [employer] of Steps: 2   Home Layout: One level                   Additional Comments: Pt appears confused and uncommunicative -- it is unclear to this therapist what pt's home set-up is.      Prior Functioning/Environment Level of Independence:  (Unclear)                 OT Problem List: Decreased strength;Decreased activity tolerance;Decreased knowledge of use of DME or AE;Decreased coordination;Decreased safety awareness;Decreased cognition;Impaired balance (sitting and/or standing)      OT Treatment/Interventions: Self-care/ADL training;DME and/or AE instruction;Therapeutic activities;Balance training;Therapeutic exercise;Cognitive remediation/compensation;Energy conservation;Patient/family education    OT Goals(Current goals can be found in the care plan section) Acute Rehab OT Goals Patient Stated Goal: pt unable to state a goal OT Goal Formulation: Patient unable to participate in goal setting Time For Goal Achievement: 10/27/19 Potential to Achieve Goals: Fair ADL Goals Pt Will Perform Grooming: with mod assist;standing (with 3 or fewer VCs) Pt Will Perform Upper Body Bathing: with min assist;standing (with 3 or fewer VCs) Pt Will Transfer to Toilet: with min assist (while able to communicate toileting needs to staff)  OT  Frequency: Min 1X/week   Barriers to D/C: Decreased caregiver support;Inaccessible home environment  unclear what pt's home set-up and supports are       Co-evaluation              AM-PAC OT "6 Clicks" Daily Activity     Outcome Measure Help from another person eating meals?: None Help from another person taking care of personal grooming?: A Little Help from another person toileting, which includes using toliet, bedpan, or urinal?: A Lot Help from another person bathing (including washing, rinsing, drying)?: A Lot Help from another person to put on and taking off regular upper body clothing?: A Lot Help from another person to put on and taking off regular lower body clothing?: A Lot 6 Click Score: 15   End of Session Nurse Communication: Mobility status;Other (comment) (pt agitated, attempting to pull out IV, had removed external catheter; attempting to get out of bed)  Activity Tolerance: Treatment limited secondary to agitation;Treatment limited secondary to medical complications (Comment) (dementia) Patient left: in bed;with call bell/phone within reach;with bed alarm set  OT Visit Diagnosis: Unsteadiness on feet (R26.81);Other abnormalities of gait and mobility (R26.89);Muscle weakness (generalized) (M62.81);Cognitive communication deficit (R41.841)                Time: 0175-1025 OT Time Calculation (min): 57 min Charges:  OT General Charges $OT Visit: 1 Visit OT Evaluation $  OT Eval High Complexity: 1 High OT Treatments $Self Care/Home Management : 53-67 mins  Latina Craver, PhD, MS, OTR/L ascom [phone] 10/13/19, 5:28 PM

## 2019-10-13 NOTE — ED Notes (Signed)
Pt assessed for knowledge of monoclonal antibody infusion for consent. Pt denies provider having discussed infusion with him. Pt reports he does not read and write well, will be unable to adequately read treatment form. Will contact provider for discussion with patient regarding treatment with antibody infusion prior to consent

## 2019-10-13 NOTE — Telephone Encounter (Signed)
Daughter, [name] calling to let Dr Sherrie Mustache know pt was admitted this morning to [facility].  She states last time she saw him when she took him to ED,  he did not look good at all.  But she did want Dr Sherrie Mustache to know.

## 2019-10-13 NOTE — TOC Initial Note (Signed)
Transition of Care [facility]) - Initial/Assessment Note    [name] MRN: [id number] Date of Birth: May 17, 1940  Transition of [facility]) CM/SW Contact:    Bing Quarry, RN Phone Number: 10/13/2019, 12:41 PM  Clinical Narrative:                 OBS status: Plan for discharge tomorrow pending patient status. Daughter called and read MOON document. Emailed her a copy. Permissions granted to seek HH/SNF placement, "whatever was needed" for her father. She is most concerned about his not eating/drinking and this was discussed in rounds. Will follow up and phone numbers given to daughter to contact CM. Pt. Poor historian and intermittently confused at this time. Gabriel Cirri RN CM.   Expected Discharge Plan: Skilled Nursing Facility Barriers to Discharge: Continued Medical Work up   Patient Goals and CMS Choice Patient states their goals for this hospitalization and ongoing recovery are:: Daughters concers surround his not eating and drinking. CMS Medicare.gov Compare Post Acute Care list provided to:: Patient Represenative (must comment) Choice offered to / list presented to : Adult Children (Daughter [name])  Expected Discharge Plan and Services Expected Discharge Plan: Skilled Nursing Facility   Discharge Planning Services: CM Consult Post Acute Care Choice: Skilled Nursing Facility Living arrangements for the past 2 months: Single Family Home                                      Prior Living Arrangements/Services Living arrangements for the past 2 months: Single Family Home Lives with:: Self Patient language and need for interpreter reviewed:: No        Need for Family Participation in Patient Care: Yes (Comment) (Coid, weakness, confusion, poor intake.) Care giver support system in place?: Yes (comment)   Criminal Activity/Legal Involvement Pertinent to Current Situation/Hospitalization: No - Comment as needed  Activities of Daily  Living Home Assistive Devices/Equipment: None ADL Screening (condition at time of admission) Patient's cognitive ability adequate to safely complete daily activities?: No Is the patient deaf or have difficulty hearing?: No Does the patient have difficulty seeing, even when wearing glasses/contacts?: Yes Does the patient have difficulty concentrating, remembering, or making decisions?: No Patient able to express need for assistance with ADLs?: Yes Does the patient have difficulty dressing or bathing?: Yes Independently performs ADLs?: No Communication: Independent Dressing (OT): Needs assistance Is this a change from baseline?: Change from baseline, expected to last >3 days Grooming: Needs assistance Is this a change from baseline?: Change from baseline, expected to last >3 days Feeding: Independent Bathing: Needs assistance Is this a change from baseline?: Change from baseline, expected to last >3 days Toileting: Needs assistance Is this a change from baseline?: Change from baseline, expected to last >3days In/Out Bed: Needs assistance Is this a change from baseline?: Change from baseline, expected to last >3 days Walks in Home: Needs assistance Is this a change from baseline?: Change from baseline, expected to last >3 days Does the patient have difficulty walking or climbing stairs?: Yes Weakness of Legs: Both Weakness of Arms/Hands: None  Permission Sought/Granted Permission sought to share information with : Case Manager, Family Supports Permission granted to share information with : Yes, Verbal Permission Granted  Share Information with NAME: [name]     Permission granted to share info w Relationship: Daughter  Permission granted to share info w Contact Information: [phone]  Emotional Assessment   Attitude/Demeanor/Rapport: Other (comment) (has been confused) Affect (typically observed): Other (comment) (Reported as oriented to self and place only.) Orientation:  : Oriented to Self, Oriented to Place Alcohol / Substance Use: Not Applicable Psych Involvement: No (comment)  Admission diagnosis:  Lower urinary tract infection [N39.0] AKI (acute kidney injury) (HCC) [N17.9] COVID-19 [U07.1] Patient Active Problem List   Diagnosis Date Noted  . COVID-19 virus infection 10/13/2019  . Cognitive impairment 10/13/2019  . AKI (acute kidney injury) (HCC) 10/12/2019  . Gynecomastia 07/07/2014  . Chronic kidney disease, stage 3a (HCC) 07/07/2014  . Frank hematuria 07/07/2014  . 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 Bleeding gastrointestinal 11/04/2012  . Carotid artery narrowing 09/15/2008  . Diabetes (HCC) 08/31/2008  . 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 Elevated PSA 03/25/2007  . BPH (benign prostatic hyperplasia) 03/09/2007  . Erectile dysfunction 03/09/2007  . GERD (gastroesophageal reflux disease) 03/09/2007  . Hypercholesteremia 03/09/2007  . Hypertension 03/09/2007   PCP:  Malva Limes, MD Pharmacy:   [facility] [phone] [place], [place] - [address] AT [facility] OF [address] [place] [address] [place] [address] Phone: [phone] Fax: [phone]     Social Determinants of Health (SDOH) Interventions    Readmission Risk Interventions No flowsheet data found.

## 2019-10-13 NOTE — Progress Notes (Signed)
Secure chat with MD regarding IV acetaminophen. Patient is now unable to take oral medications and has  AKI precluding nsaid use.

## 2019-10-13 NOTE — Progress Notes (Signed)
PROGRESS NOTE    Ernest Lewis  DGL:[id number] DOB: [birth year] DOA: 10/12/2019 PCP: Malva Limes, MD   Brief Narrative:  Ernest Lewis is a 79 y.o. male with medical history significant for type 2 diabetes, hypertension, CKD stage IIIa, history of GI bleed and GERD who presents with concerns of decreased p.o. intake.  Patient only alert and oriented to self and place and was unable to provide any history about why he is here in the ED.  No family at bedside.  History obtained entirely through ED documentation ED physician.  Apparently patient was diagnosed with Covid about 2 days ago and has had significant decreased p.o. intake with fever.  Reportedly patient has also had increased confusion for the past 2 months and family has been awaiting outpatient work-up for this.  Patient incidentally was found to be Covid positive.  No oxygen requirement or pneumonia noted on chest x-ray.  Monoclonal antibody infusion ordered and administered.  Long conversation with patient's daughter via phone.  Patient has had functional decline over the past several months.  Forgetfulness, poor p.o. intake, poor self-care, medication nonadherence.  Patient lives alone and has children living locally but no one that can provide him round-the-clock care.   Assessment & Plan:   Principal Problem:   AKI (acute kidney injury) (HCC) Active Problems:   COVID-19 virus infection   Cognitive impairment  AKI  Suspect secondary to dehydration/20 depletion Has received 1 L of normal saline fluid in the ED.   Creatinine proved over interval Not baseline Plan: Continue normal saline 75 cc/h Avoid nephrotoxic agent  ? UTI UA shows many bacteria, few WBC and negative nitrite and leukocyte.  He was started on IV Rocephin in the ED.   Plan: Hold further antibiotics at this time Follow urine culture  COVID-19 infection No signs of any respiratory distress or significant GI symptoms.   patient agreeable to  monoclonal antibody yesterday per PCP documentation but has not yet received-  Plan: Administer monoclonal antibody infusion  Cognitive impairment Poor p.o. intake Functional decline Reportedly has been ongoing for several months.   No focal neurological deficits.   Daughter was concerned about patient's ability to care for himself Does not eat food nor take medications regularly Plan: Initiate Remeron 15 mg nightly PT and OT consults Nutrition consult TOC consult Patient would likely benefit from SNF versus long-term care placement versus home with home health   DVT prophylaxis: Lovenox Code Status: Full Family Communication: Daughter [name] [phone] on 10/13/2019 Disposition Plan: Status is: Observation  The patient remains OBS appropriate and will d/c before 2 midnights.  Dispo: The patient is from: Home              Anticipated d/c is to: Home              Anticipated d/c date is: 1 day              Patient currently is not medically stable to d/c.  Patient still with evidence of acute kidney injury presumably prerenal azotemia.  Receiving IV fluids.  Following urine culture for pathogen identification.  Also patient needs consultation with physical therapy, Occupational Therapy, nutrition, TOC in order to determine best discharge plan.   Consultants:   None  Procedures:   None  Antimicrobials:   None   Subjective: Seen and examined.  No specific complaints.  Confused  Objective: Vitals:   10/13/19 0915 10/13/19 0945 10/13/19 1013 10/13/19 1100  BP: Marland Kitchen)  154/77 (!) 148/112 (!) 174/89 125/84  Pulse: (!) 52 (!) 56 (!) 51 (!) 52  Resp: 20 18 18 18   Temp: 99.4 F (37.4 C) 99.3 F (37.4 C) 99.2 F (37.3 C) 99.3 F (37.4 C)  TempSrc: Oral Oral Oral Oral  SpO2: 98% 100% 100% 99%  Weight:      Height:        Intake/Output Summary (Last 24 hours) at 10/13/2019 1218 Last data filed at 10/13/2019 0014 Gross per 24 hour  Intake 1000 ml  Output  200 ml  Net 800 ml   Filed Weights   10/12/19 1634  Weight: 83.9 kg    Examination:  General exam: No acute distress.  Appears frail Respiratory system: Poor respiratory effort.  Decreased at bases.  Normal work of breathing.  Room air Cardiovascular system: S1 & S2 heard, RRR. No JVD, murmurs, rubs, gallops or clicks. No pedal edema. Gastrointestinal system: Abdomen is nondistended, soft and nontender. No organomegaly or masses felt. Normal bowel sounds heard. Central nervous system: Alert to person only.  No focal deficits  extremities: Diffusely decreased power bilateral upper and lower extremities Skin: No rashes, lesions or ulcers Psychiatry: Judgement and insight appear poor. Mood & affect flattened.     Data Reviewed: I have personally reviewed following labs and imaging studies  CBC: Recent Labs  Lab 10/12/19 1637 10/13/19 0655  WBC 4.8 5.9  NEUTROABS  --  4.9  HGB 12.8* 13.1  HCT 38.5* 41.3  MCV 80.0 83.1  PLT 132* 143*   Basic Metabolic Panel: Recent Labs  Lab 10/12/19 1637 10/13/19 0655  NA 147* 149*  K 4.0 4.4  CL 110 114*  CO2 28 24  GLUCOSE 111* 108*  BUN 35* 29*  CREATININE 2.26* 1.90*  CALCIUM 8.9 8.9   GFR: Estimated Creatinine Clearance: 32.2 mL/min (A) (by C-G formula based on SCr of 1.9 mg/dL (H)). Liver Function Tests: Recent Labs  Lab 10/12/19 1637 10/13/19 0655  AST 54* 54*  ALT 28 30  ALKPHOS 42 42  BILITOT 0.7 0.7  PROT 7.5 7.3  ALBUMIN 3.6 3.6   No results for input(s): LIPASE, AMYLASE in the last 168 hours. No results for input(s): AMMONIA in the last 168 hours. Coagulation Profile: No results for input(s): INR, PROTIME in the last 168 hours. Cardiac Enzymes: No results for input(s): CKTOTAL, CKMB, CKMBINDEX, TROPONINI in the last 168 hours. BNP (last 3 results) No results for input(s): PROBNP in the last 8760 hours. HbA1C: No results for input(s): HGBA1C in the last 72 hours. CBG: Recent Labs  Lab 10/12/19 1642  10/13/19 1146  GLUCAP 104* 83   Lipid Profile: No results for input(s): CHOL, HDL, LDLCALC, TRIG, CHOLHDL, LDLDIRECT in the last 72 hours. Thyroid Function Tests: No results for input(s): TSH, T4TOTAL, FREET4, T3FREE, THYROIDAB in the last 72 hours. Anemia Panel: No results for input(s): VITAMINB12, FOLATE, FERRITIN, TIBC, IRON, RETICCTPCT in the last 72 hours. Sepsis Labs: No results for input(s): PROCALCITON, LATICACIDVEN in the last 168 hours.  Recent Results (from the past 240 hour(s))  Novel Coronavirus, NAA ([facility])     Status: Abnormal   Collection Time: 10/10/19  4:28 PM   Specimen: Nasopharyngeal(NP) swabs in vial transport medium   Nasopharynge  Is this  Result Value Ref Range Status   SARS-CoV-2, NAA Detected (A) Not Detected Final    Comment: Patients who have a positive COVID-19 test result may now have treatment options. Treatment options are available for patients with mild to  moderate symptoms and for hospitalized patients. Visit our website at [email or web address] for resources and information. This nucleic acid amplification test was developed and its performance characteristics determined by [employer]. Nucleic acid amplification tests include RT-PCR and TMA. This test has not been FDA cleared or approved. This test has been authorized by FDA under an Emergency Use Authorization (EUA). This test is only authorized for the duration of time the declaration that circumstances exist justifying the authorization of the emergency use of in vitro diagnostic tests for detection of SARS-CoV-2 virus and/or diagnosis of COVID-19 infection under section 564(b)(1) of the Act, 21 U.S.C. 628BTD-1(V) (1), unless the authorization is terminated or revoked sooner. When diagnostic testing is negativ e, the possibility of a false negative result should be considered in the context of a patient's recent exposures and the presence of clinical signs and  symptoms consistent with COVID-19. An individual without symptoms of COVID-19 and who is not shedding SARS-CoV-2 virus would expect to have a negative (not detected) result in this assay.   SARS-COV-2, NAA 2 DAY TAT     Status: None   Collection Time: 10/10/19  4:28 PM   Nasopharynge  Is this  Result Value Ref Range Status   SARS-CoV-2, NAA 2 DAY TAT Performed  Final         Radiology Studies: DG Abd 1 View  Result Date: 10/13/2019 CLINICAL DATA:  Constipation EXAM: ABDOMEN - 1 VIEW COMPARISON:  None. FINDINGS: Diffusely gas-filled, nondistended small bowel and colon with gas present to the rectum. There is scattered stool throughout the colon. No free air on supine radiographs. IMPRESSION: Diffusely gas-filled, nondistended small bowel and colon with gas present to the rectum. There is scattered stool throughout the colon. Electronically Signed   By: Lauralyn Primes M.D.   On: 10/13/2019 09:16        Scheduled Meds: . amLODipine  10 mg Oral Daily  . enoxaparin (LOVENOX) injection  40 mg Subcutaneous Q24H  . insulin aspart  0-15 Units Subcutaneous TID WC  . insulin aspart  0-5 Units Subcutaneous QHS  . mirtazapine  15 mg Oral QHS  . simethicone  80 mg Oral QID   Continuous Infusions: . sodium chloride    . sodium chloride    . famotidine (PEPCID) IV       LOS: 0 days    Time spent: 25 minutes    Tresa Moore, MD [facility] Pager [phone]  If 7PM-7AM, please contact night-coverage 10/13/2019, 12:18 PM

## 2019-10-13 NOTE — Progress Notes (Signed)
OT Cancellation Note  Patient Details Name: Ernest Lewis MRN: [id number] DOB: 1940/02/18   Cancelled Treatment:    Reason Eval/Treat Not Completed: Patient at procedure or test/ unavailable. Consult received, chart reviewed. Pt out of room. Will re-attempt at later date/time as pt is available, medically appropriate, and as schedule permits.  Richrd Prime, MPH, MS, OTR/L ascom [phone] 10/13/19, 8:33 AM

## 2019-10-13 NOTE — ED Notes (Signed)
Pt alert, able to stand at bedside to use urinal. Pt oriented to situation.

## 2019-10-13 NOTE — ED Notes (Signed)
NP messaged regarding patient consent. Awaiting response

## 2019-10-13 NOTE — NC FL2 (Signed)
[place] MEDICAID FL2 LEVEL OF CARE SCREENING TOOL     IDENTIFICATION  Patient Name: Ernest Lewis Birthdate: 09-26-1940 Sex: male Admission Date (Current Location): 10/12/2019  [facility] and [place] Number:  Chiropodist and Address:  [facility] [address], [place], [place] [address]      Provider Number: [id number]  Attending Physician Name and Address:  Tresa Moore, MD  Relative Name and Phone Number:  Daugther [name] [phone]    Current Level of Care: Hospital Recommended Level of Care: Skilled Nursing Facility Prior Approval Number:    Date Approved/Denied:   PASRR Number: [id number]  Discharge Plan: SNF    Current Diagnoses: Patient Active Problem List   Diagnosis Date Noted   COVID-19 virus infection 10/13/2019   Cognitive impairment 10/13/2019   AKI (acute kidney injury) (HCC) 10/12/2019   Gynecomastia 07/07/2014   Chronic kidney disease, stage 3a (HCC) 07/07/2014   Frank hematuria 07/07/2014   Hypogonadism in male 07/07/2014   Bleeding gastrointestinal 11/04/2012   Carotid artery narrowing 09/15/2008   Diabetes (HCC) 08/31/2008   Alcohol use 11/30/2007   Elevated PSA 03/25/2007   BPH (benign prostatic hyperplasia) 03/09/2007   Erectile dysfunction 03/09/2007   GERD (gastroesophageal reflux disease) 03/09/2007   Hypercholesteremia 03/09/2007   Hypertension 03/09/2007    Orientation RESPIRATION BLADDER Height & Weight     Self, Place  Normal Incontinent Weight: 83.9 kg Height:  5\' 8"  (172.7 cm)  BEHAVIORAL SYMPTOMS/MOOD NEUROLOGICAL BOWEL NUTRITION STATUS      Continent Diet  AMBULATORY STATUS COMMUNICATION OF NEEDS Skin   Limited Assist Verbally Normal                       Personal Care Assistance Level of Assistance  Bathing, Feeding, Dressing Bathing Assistance: Limited assistance Feeding assistance: Limited assistance Dressing Assistance: Limited assistance      Functional Limitations Info  Sight (Glasses)          SPECIAL CARE FACTORS FREQUENCY  PT (By licensed PT), OT (By licensed OT)     PT Frequency: 5x/week OT Frequency: 5x/week            Contractures Contractures Info: Not present    Additional Factors Info  Code Status, Allergies, Isolation Precautions Code Status Info: Full Code Allergies Info: No Known Allergies     Isolation Precautions Info: Airborne     Current Medications (10/13/2019):  This is the current hospital active medication list Current Facility-Administered Medications  Medication Dose Route Frequency Provider Last Rate Last Admin   0.9 %  sodium chloride infusion   Intravenous PRN Tu, Ching T, DO       0.9 %  sodium chloride infusion   Intravenous Continuous 12-03-2003 B, MD 75 mL/hr at 10/13/19 1424 New Bag at 10/13/19 1424   acetaminophen (TYLENOL) tablet 650 mg  650 mg Oral Q6H PRN Sreenath, Sudheer B, MD       albuterol (VENTOLIN HFA) 108 (90 Base) MCG/ACT inhaler 2 puff  2 puff Inhalation Once PRN Tu, Ching T, DO       amLODipine (NORVASC) tablet 10 mg  10 mg Oral Daily Sreenath, Sudheer B, MD   10 mg at 10/13/19 1108   diphenhydrAMINE (BENADRYL) injection 50 mg  50 mg Intravenous Once PRN Tu, Ching T, DO       enoxaparin (LOVENOX) injection 40 mg  40 mg Subcutaneous Q24H Shanlever, 12-03-2003, [facility]  EPINEPHrine (EPI-PEN) injection 0.3 mg  0.3 mg Intramuscular Once PRN Tu, Ching T, DO       famotidine (PEPCID) IVPB 20 mg premix  20 mg Intravenous Once PRN Tu, Ching T, DO       haloperidol lactate (HALDOL) injection 2 mg  2 mg Intramuscular Q6H PRN Georgeann Oppenheim, Sudheer B, MD   2 mg at 10/13/19 1455   insulin aspart (novoLOG) injection 0-15 Units  0-15 Units Subcutaneous TID WC Sreenath, Sudheer B, MD       insulin aspart (novoLOG) injection 0-5 Units  0-5 Units Subcutaneous QHS Sreenath, Sudheer B, MD       methylPREDNISolone sodium succinate (SOLU-MEDROL) 125 mg/2 mL injection  125 mg  125 mg Intravenous Once PRN Tu, Ching T, DO       mirtazapine (REMERON) tablet 15 mg  15 mg Oral QHS Sreenath, Sudheer B, MD       senna-docusate (Senokot-S) tablet 1 tablet  1 tablet Oral BID PRN Lolita Patella B, MD       simethicone (MYLICON) chewable tablet 80 mg  80 mg Oral QID Tresa Moore, MD         Discharge Medications: Please see discharge summary for a list of discharge medications.  Relevant Imaging Results:  Relevant Lab Results:   Additional Information [id number]  Bing Quarry, RN

## 2019-10-13 NOTE — Evaluation (Signed)
Physical Therapy Evaluation Patient Details Name: Ernest Lewis MRN: [id number] DOB: 09/05/1940 Today's Date: 10/13/2019   History of Present Illness  Pt is an 79 y.o. male presenting to hospital 10/6 with concerns of decreased p.o. intake.  (+) COVID-19.  Per notes pt with functional decline past several months; forgetfulness, poor self care, and medication nonadherence.  Pt admitted with AKI secondary to hypovolemia, ? UTI, COVID-19 infection, and cognitive impairment.  PMH includes alcohol use, carotid artery stenosis, DM, htn, CKD stage 3a, and h/o prostate surgery.  Clinical Impression  Prior to hospital admission, pt was ambulatory and per pt and chart review pt lives alone.  Currently pt is SBA semi-supine to sitting edge of bed; CGA to min assist with transfers; and CGA to min assist to ambulate 40 feet with RW.  Pt appearing unsteady taking steps bed to recliner so trialed RW with ambulation.  Pt pleasant and laughing intermittently during session but pt also appeared confused during session (oriented to self only) and required assist for walker management and also balance with ambulation.  Pt would benefit from skilled PT to address noted impairments and functional limitations (see below for any additional details).  Upon hospital discharge, pt would benefit from STR.    Follow Up Recommendations SNF    Equipment Recommendations  Rolling walker with 5" wheels;3in1 (PT)    Recommendations for Other Services OT consult     Precautions / Restrictions Precautions Precautions: Fall Restrictions Weight Bearing Restrictions: No      Mobility  Bed Mobility Overal bed mobility: Needs Assistance Bed Mobility: Supine to Sit     Supine to sit: Supervision;HOB elevated     General bed mobility comments: mild increased effort to sit up on edge of bed on own  Transfers Overall transfer level: Needs assistance Equipment used: None;Rolling walker (2 wheeled) Transfers: Sit to/from  [employer] Sit to Stand: Min guard;Min assist Stand pivot transfers: Min guard;Min assist (stand step turn bed to recliner (pt unsteady requiring min assist for balance))       General transfer comment: x1 trial standing from bed and x2 trials standing from recliner; vc's for UE placement when using walker  Ambulation/Gait Ambulation/Gait assistance: Min guard;Min assist Gait Distance (Feet): 40 Feet Assistive device: Rolling walker (2 wheeled)   Gait velocity: decreased   General Gait Details: pt appearing shaky walking and needing assist with walker navigation; pt occasionally with mild loss of balance to R requiring assist to maintain balance  Stairs            Wheelchair Mobility    Modified Rankin (Stroke Patients Only)       Balance Overall balance assessment: Needs assistance Sitting-balance support: No upper extremity supported;Feet supported Sitting balance-Leahy Scale: Good Sitting balance - Comments: steady sitting reaching within BOS     Standing balance-Leahy Scale: Fair Standing balance comment: steady static standing urinating but requiring at least single UE support for dynamic standing activities                             Pertinent Vitals/Pain Pain Assessment: Faces Faces Pain Scale: No hurt Pain Intervention(s): Limited activity within patient's tolerance;Monitored during session;Repositioned  Vitals (HR and O2 on room air) stable and WFL throughout treatment session.    Home Living Family/patient expects to be discharged to:: Private residence Living Arrangements: Alone   Type of Home: House Home Access: Stairs to enter Entrance Stairs-Rails: Right;Left Entrance  Stairs-Number of Steps: 2 Home Layout: One level   Additional Comments: Pt providing minimal details regarding home set-up when asked (pt appearing confused during session so unsure of accuracy of details).    Prior Function Level of Independence:  Independent         Comments: Pt reports being independent with ambulation and no recent falls (pt appearing confused during session so unsure of accuracy of details).     Hand Dominance        Extremity/Trunk Assessment   Upper Extremity Assessment Upper Extremity Assessment: Generalized weakness    Lower Extremity Assessment Lower Extremity Assessment: Generalized weakness    Cervical / Trunk Assessment Cervical / Trunk Assessment: Normal  Communication   Communication:  (appearing HOH at times)  Cognition Arousal/Alertness: Awake/alert Behavior During Therapy:  (pt intermittently laughing during session) Overall Cognitive Status: No family/caregiver present to determine baseline cognitive functioning                                 General Comments: Oriented to person (name and DOB) only      General Comments   Nursing cleared pt for participation in physical therapy.  Pt agreeable to PT session.    Exercises  Transfer and gait training   Assessment/Plan    PT Assessment Patient needs continued PT services  PT Problem List Decreased strength;Decreased activity tolerance;Decreased balance;Decreased mobility;Decreased cognition;Decreased knowledge of use of DME;Decreased safety awareness;Decreased knowledge of precautions       PT Treatment Interventions DME instruction;Gait training;Stair training;Functional mobility training;Therapeutic activities;Therapeutic exercise;Balance training;Patient/family education    PT Goals (Current goals can be found in the Care Plan section)  Acute Rehab PT Goals Patient Stated Goal: to walk more PT Goal Formulation: With patient Time For Goal Achievement: 10/27/19 Potential to Achieve Goals: Good    Frequency Min 2X/week   Barriers to discharge Decreased caregiver support      Co-evaluation               AM-PAC PT "6 Clicks" Mobility  Outcome Measure Help needed turning from your back to your  side while in a flat bed without using bedrails?: None Help needed moving from lying on your back to sitting on the side of a flat bed without using bedrails?: A Little Help needed moving to and from a bed to a chair (including a wheelchair)?: A Little Help needed standing up from a chair using your arms (e.g., wheelchair or bedside chair)?: A Little Help needed to walk in hospital room?: A Little Help needed climbing 3-5 steps with a railing? : A Little 6 Click Score: 19    End of Session Equipment Utilized During Treatment: Gait belt Activity Tolerance: Patient tolerated treatment well Patient left: in chair;with call bell/phone within reach;with chair alarm set Nurse Communication: Mobility status;Precautions PT Visit Diagnosis: Unsteadiness on feet (R26.81);Other abnormalities of gait and mobility (R26.89);Muscle weakness (generalized) (M62.81)    Time: 7672-0947 PT Time Calculation (min) (ACUTE ONLY): 36 min   Charges:   PT Evaluation $PT Eval Low Complexity: 1 Low PT Treatments $Gait Training: 8-22 mins $Therapeutic Activity: 8-22 mins       Hendricks Limes, PT 10/13/19, 1:35 PM

## 2019-10-14 DIAGNOSIS — N179 Acute kidney failure, unspecified: Secondary | ICD-10-CM | POA: Diagnosis not present

## 2019-10-14 LAB — GLUCOSE, CAPILLARY
Glucose-Capillary: 60 mg/dL — ABNORMAL LOW (ref 70–99)
Glucose-Capillary: 69 mg/dL — ABNORMAL LOW (ref 70–99)
Glucose-Capillary: 160 mg/dL — ABNORMAL HIGH (ref 70–99)
Glucose-Capillary: 87 mg/dL (ref 70–99)
Glucose-Capillary: 68 mg/dL — ABNORMAL LOW (ref 70–99)
Glucose-Capillary: 99 mg/dL (ref 70–99)
Glucose-Capillary: 84 mg/dL (ref 70–99)

## 2019-10-14 LAB — COMPREHENSIVE METABOLIC PANEL
ALT: 27 U/L (ref 0–44)
AST: 49 U/L — ABNORMAL HIGH (ref 15–41)
Albumin: 3 g/dL — ABNORMAL LOW (ref 3.5–5.0)
Alkaline Phosphatase: 38 U/L (ref 38–126)
Anion gap: 10 (ref 5–15)
BUN: 26 mg/dL — ABNORMAL HIGH (ref 8–23)
CO2: 27 mmol/L (ref 22–32)
Calcium: 8.8 mg/dL — ABNORMAL LOW (ref 8.9–10.3)
Chloride: 117 mmol/L — ABNORMAL HIGH (ref 98–111)
Creatinine, Ser: 1.56 mg/dL — ABNORMAL HIGH (ref 0.61–1.24)
GFR calc non Af Amer: 41 mL/min — ABNORMAL LOW (ref >60)
Glucose, Bld: 91 mg/dL (ref 70–99)
Potassium: 4.8 mmol/L (ref 3.5–5.1)
Sodium: 154 mmol/L — ABNORMAL HIGH (ref 135–145)
Total Bilirubin: 0.8 mg/dL (ref 0.3–1.2)
Total Protein: 6.9 g/dL (ref 6.5–8.1)

## 2019-10-14 LAB — CBC WITH DIFFERENTIAL/PLATELET
Abs Immature Granulocytes: 0.04 10*3/uL (ref 0.00–0.07)
Basophils Absolute: 0 10*3/uL (ref 0.0–0.1)
Basophils Relative: 0 %
Eosinophils Absolute: 0 10*3/uL (ref 0.0–0.5)
Eosinophils Relative: 0 %
HCT: 38.3 % — ABNORMAL LOW (ref 39.0–52.0)
Hemoglobin: 12.1 g/dL — ABNORMAL LOW (ref 13.0–17.0)
Immature Granulocytes: 1 %
Lymphocytes Relative: 11 %
Lymphs Abs: 0.7 10*3/uL (ref 0.7–4.0)
MCH: 26.5 pg (ref 26.0–34.0)
MCHC: 31.6 g/dL (ref 30.0–36.0)
MCV: 84 fL (ref 80.0–100.0)
Monocytes Absolute: 0.3 10*3/uL (ref 0.1–1.0)
Monocytes Relative: 5 %
Neutro Abs: 5.4 10*3/uL (ref 1.7–7.7)
Neutrophils Relative %: 83 %
Platelets: 145 10*3/uL — ABNORMAL LOW (ref 150–400)
RBC: 4.56 MIL/uL (ref 4.22–5.81)
RDW: 13.9 % (ref 11.5–15.5)
WBC: 6.5 10*3/uL (ref 4.0–10.5)
nRBC: 0 % (ref 0.0–0.2)

## 2019-10-14 LAB — URINE CULTURE: Culture: NO GROWTH

## 2019-10-14 LAB — SODIUM: Sodium: 149 mmol/L — ABNORMAL HIGH (ref 135–145)

## 2019-10-14 MED ORDER — ENSURE ENLIVE PO LIQD
237.0000 mL | Freq: Three times a day (TID) | ORAL | Status: DC
  Administered 2019-10-14 – 2019-10-19 (×11): 237 mL via ORAL

## 2019-10-14 MED ORDER — DEXTROSE 50 % IV SOLN
12.5000 g | INTRAVENOUS | Status: AC
  Administered 2019-10-14: 08:00:00 12.5 g via INTRAVENOUS
  Filled 2019-10-14: qty 50

## 2019-10-14 MED ORDER — DEXTROSE 5 % IV SOLN: INTRAVENOUS | Status: AC

## 2019-10-14 NOTE — Progress Notes (Signed)
Initial Nutrition Assessment  DOCUMENTATION CODES:   Severe malnutrition in context of chronic illness  INTERVENTION:  Downgrade diet to dysphagia 2 (fine chop).  Provide Ensure Enlive po TID between meals, each supplement provides 350 kcal and 20 grams of protein.  Provide Magic cup TID with meals, each supplement provides 290 kcal and 9 grams of protein.  Monitor magnesium, potassium, and phosphorus daily for at least 3 days, MD to replete as needed, as pt is at risk for refeeding syndrome given severe malnutrition, hx EtOH use.  NUTRITION DIAGNOSIS:   Severe Malnutrition related to chronic illness (CKD, hx EtOH use, suspected inadequate oral intake) as evidenced by severe fat depletion, severe muscle depletion.  GOAL:   Patient will meet greater than or equal to 90% of their needs  MONITOR:   PO intake, Supplement acceptance, Labs, Weight trends, I & O's  REASON FOR ASSESSMENT:   Malnutrition Screening Tool, Consult Assessment of nutrition requirement/status  ASSESSMENT:   79 year old male with PMHx of EtOH use, DM, HTN, CKD stage III, GERD admitted with acute metabolic encephalopathy, hypernatremia, AKI, possible UTI, COVID-19 infection, cognitive impairment, functional decline.   Met with patient at bedside. He is a poor historian and unable to provide any nutrition/weight history. Safety sitter present in room. Also discussed with RN. Patient ate fairly well at breakfast and finished about 50% of his tray. He declined his lunch.   Spoke with patient's daughter Hoyle Sauer over the phone. She reports patient lives alone in a trailer. She reports he typically has a good appetite and intake but is unsure of exact details since he lives alone. She reports lately over the past 2 weeks he has had a decreased appetite and intake. He has been eating smaller meals or occasionally only eating bites at meals. She is unsure of UBW or exact details of weight trend but reports patient has  been losing weight.  Per review of chart patient was 95.3 kg on 11/03/2016, 87.5 kg on 04/17/2017, 84.1 kg on 10/22/2017. He is currently documented to be 83.9 kg (185 lbs) but unsure if this is accurate as patient appears to weigh less.  Medications reviewed and include: folic acid 1 mg daily, Novolog 0-15 units TID, Novolog 0-5 units QHS, Remeron 15 mg QHS, MVI daily, simethicone 80 mg QID, ceftriaxone, D5W at 50 mL/hr, famotidine, thiamine 500 mg TID IV.  Labs reviewed: CBG 60-160, Sodium 154, Chloride 117, BUN 26, Creatinine 1.56.  NUTRITION - FOCUSED PHYSICAL EXAM:    Most Recent Value  Orbital Region Severe depletion  Upper Arm Region Severe depletion  Thoracic and Lumbar Region Severe depletion  Buccal Region Severe depletion  Temple Region Severe depletion  Clavicle Bone Region Severe depletion  Clavicle and Acromion Bone Region Severe depletion  Scapular Bone Region Severe depletion  Dorsal Hand Moderate depletion  Patellar Region Severe depletion  Anterior Thigh Region Severe depletion  Posterior Calf Region Severe depletion  Edema (RD Assessment) None  Hair Reviewed  Eyes Reviewed  Mouth Reviewed  [poor dentition]  Skin Reviewed  Nails Reviewed     Diet Order:   Diet Order            Diet Heart Room service appropriate? Yes; Fluid consistency: Thin  Diet effective now                EDUCATION NEEDS:   No education needs have been identified at this time  Skin:  Skin Assessment: Reviewed RN Assessment  Last BM:  Unknown  Height:   Ht Readings from Last 1 Encounters:  10/12/19 5' 8"  (1.727 m)   Weight:   Wt Readings from Last 1 Encounters:  10/12/19 83.9 kg   Ideal Body Weight:  70 kg  BMI:  Body mass index is 28.13 kg/m.  Estimated Nutritional Needs:   Kcal:  2100-2300  Protein:  105-115 grams  Fluid:  >/= 2 L/day  Jacklynn Barnacle, MS, RD, LDN Pager number available on Amion

## 2019-10-14 NOTE — TOC Progression Note (Signed)
Transition of Care [facility]) - Progression Note    [name] MRN: [id number] Date of Birth: December 05, 1940  Transition of [facility]) CM/SW Contact  Allayne Butcher, RN Phone Number: 10/14/2019, 2:31 PM  Clinical Narrative:    RNCM updated patient's daughter, [name] on discharge planning.  PT has recommended SNF at discharge and daughter is agreeable.  [facility] beds, daughter chooses [facility].  Patient is not yet medically stable for discharge.  TOC to cont to follow.    Expected Discharge Plan: Skilled Nursing Facility Barriers to Discharge: Continued Medical Work up  Expected Discharge Plan and Services Expected Discharge Plan: Skilled Nursing Facility   Discharge Planning Services: CM Consult Post Acute Care Choice: Skilled Nursing Facility Living arrangements for the past 2 months: Single Family Home                                       Social Determinants of Health (SDOH) Interventions    Readmission Risk Interventions No flowsheet data found.

## 2019-10-14 NOTE — Progress Notes (Signed)
Pt Alert to self and place resting in bed calm and cooperative. Pt with no complaints. Assisted with ADLS and encouraged to eat more.  No adverse events or acute changes.  Sitter at bedside. Will continue to monitor.

## 2019-10-14 NOTE — Progress Notes (Signed)
Palliative Medicine RN Note: Consult order noted for GOC r/t COVID, AKI, agitation.  Unfortunately, due to high consult volume, PMT will not have an available provider today and does not have weekend coverage at [facility]. We will see Mr Garfinkle Monday 10/11. If recommendations are needed in the interim, please call our office at [phone].  Margret Chance Tollie Canada, RN, BSN, [facility] Palliative Medicine Team 10/14/2019 10:17 AM Office [phone]

## 2019-10-14 NOTE — Progress Notes (Signed)
PROGRESS NOTE    Ernest Lewis  MGN:[id number] DOB: [birth year] DOA: 10/12/2019 PCP: Malva Limes, MD   Brief Narrative:  Ernest Lewis is a 79 y.o. male with medical history significant for type 2 diabetes, hypertension, CKD stage IIIa, history of GI bleed and GERD who presents with concerns of decreased p.o. intake.  Patient only alert and oriented to self and place and was unable to provide any history about why he is here in the ED.  No family at bedside.  History obtained entirely through ED documentation ED physician.  Apparently patient was diagnosed with Covid about 2 days ago and has had significant decreased p.o. intake with fever.  Reportedly patient has also had increased confusion for the past 2 months and family has been awaiting outpatient work-up for this.  Patient incidentally was found to be Covid positive.  No oxygen requirement or pneumonia noted on chest x-ray.  Monoclonal antibody infusion ordered and administered.  Long conversation with patient's daughter via phone.  Patient has had functional decline over the past several months.  Forgetfulness, poor p.o. intake, poor self-care, medication nonadherence.  Patient lives alone and has children living locally but no one that can provide him round-the-clock care.   Assessment & Plan:   Principal Problem:   AKI (acute kidney injury) (HCC) Active Problems:   COVID-19 virus infection   Cognitive impairment  Acute metabolic encephalopathy Unclear etiology.  Patient became very agitated and decreased level consciousness on 10/13/2019.  Received IM Haldol with good result.  Patient is more awake this morning.  Baseline level of mentation is unclear. Plan: Continue one-to-one sitter for now As needed IM Haldol Delirium precautions  Hypernatremia Likely secondary to poor p.o. intake DC normal saline Start D5 water 50 cc/h Recheck serum sodium this afternoon  AKI  Suspect secondary to dehydration/volume  depletion Has received 1 L of normal saline fluid in the ED.   Creatinine improved over interval Not baseline Plan: Continue normal saline 75 cc/h Avoid nephrotoxic agent  ? UTI UA shows many bacteria, few WBC and negative nitrite and leukocyte.  He was started on IV Rocephin in the ED.   Urinalysis negative Blood cultures pending We will continue antibiotics for now.  DC if blood cultures negative  COVID-19 infection No signs of any respiratory distress or significant GI symptoms.   patient agreeable to monoclonal antibody Fever T-max 103 on 10/13/2019 Plan: Received monoclonal antibody infusion Monitor fever curve and vitals  Cognitive impairment Poor p.o. intake Functional decline Reportedly has been ongoing for several months.   No focal neurological deficits.   Daughter was concerned about patient's ability to care for himself Does not eat food nor take medications regularly Plan: Continue Remeron 15 mg nightly PT and OT consults, recommend SNF Nutrition consult TOC consult Patient will need to be sitter free for 24 hours prior to discharge to SNF   DVT prophylaxis: Lovenox Code Status: Full Family Communication: Daughter [name] [phone] on 10/13/2019 Disposition Plan: Status is: Inpatient  Remains inpatient appropriate because:Altered mental status and Inpatient level of care appropriate due to severity of illness   Dispo: The patient is from: Home              Anticipated d/c is to: SNF              Anticipated d/c date is: 1 day              Patient currently is not medically stable to  d/c.   Rather unclear presentation.  Patient with Covid positivity however no evidence of pneumonia.  Had fever of T-max 103 on 07/13/2019.  Mental status is also been waxing and waning.  P.o. intake is also been very poor.  Would like some improvement in p.o. tolerance and return to baseline level of mentation as well as at least 24 hours fever free prior to  disposition planning.  Consultants:   None  Procedures:   None  Antimicrobials:   None   Subjective: Seen and examined.  No specific complaints.  Confused  Objective: Vitals:   10/14/19 0024 10/14/19 0456 10/14/19 0746 10/14/19 1136  BP: (!) 153/79 (!) 162/79 (!) 146/72 (!) 173/91  Pulse: 64 (!) 50 (!) 53 (!) 49  Resp: 18 17 18 19   Temp: 97.8 F (36.6 C) 98.6 F (37 C) 99 F (37.2 C) 97.8 F (36.6 C)  TempSrc: Oral Oral Oral Oral  SpO2: 97% 100% 100% 100%  Weight:      Height:        Intake/Output Summary (Last 24 hours) at 10/14/2019 1255 Last data filed at 10/14/2019 0500 Gross per 24 hour  Intake 674.69 ml  Output 600 ml  Net 74.69 ml   Filed Weights   10/12/19 1634  Weight: 83.9 kg    Examination:  General exam: No acute distress.  Appears frail Respiratory system: Poor respiratory effort.  Decreased at bases.  Normal work of breathing.  Room air Cardiovascular system: S1 & S2 heard, RRR. No JVD, murmurs, rubs, gallops or clicks. No pedal edema. Gastrointestinal system: Abdomen is nondistended, soft and nontender. No organomegaly or masses felt. Normal bowel sounds heard. Central nervous system: Alert to person only.  No focal deficits  extremities: Diffusely decreased power bilateral upper and lower extremities Skin: No rashes, lesions or ulcers Psychiatry: Judgement and insight appear poor. Mood & affect flattened.     Data Reviewed: I have personally reviewed following labs and imaging studies  CBC: Recent Labs  Lab 10/12/19 1637 10/13/19 0655 10/13/19 1638 10/14/19 0530  WBC 4.8 5.9 7.2 6.5  NEUTROABS  --  4.9  --  5.4  HGB 12.8* 13.1 12.7* 12.1*  HCT 38.5* 41.3 40.0 38.3*  MCV 80.0 83.1 82.6 84.0  PLT 132* 143* 150 145*   Basic Metabolic Panel: Recent Labs  Lab 10/12/19 1637 10/13/19 0655 10/13/19 1638 10/14/19 0530  NA 147* 149*  --  154*  K 4.0 4.4  --  4.8  CL 110 114*  --  117*  CO2 28 24  --  27  GLUCOSE 111* 108*  --   91  BUN 35* 29*  --  26*  CREATININE 2.26* 1.90*  --  1.56*  CALCIUM 8.9 8.9  --  8.8*  MG  --   --  2.5*  --   PHOS  --   --  2.7  --    GFR: Estimated Creatinine Clearance: 39.2 mL/min (A) (by C-G formula based on SCr of 1.56 mg/dL (H)). Liver Function Tests: Recent Labs  Lab 10/12/19 1637 10/13/19 0655 10/14/19 0530  AST 54* 54* 49*  ALT 28 30 27   ALKPHOS 42 42 38  BILITOT 0.7 0.7 0.8  PROT 7.5 7.3 6.9  ALBUMIN 3.6 3.6 3.0*   No results for input(s): LIPASE, AMYLASE in the last 168 hours. No results for input(s): AMMONIA in the last 168 hours. Coagulation Profile: No results for input(s): INR, PROTIME in the last 168 hours. Cardiac Enzymes: No results  for input(s): CKTOTAL, CKMB, CKMBINDEX, TROPONINI in the last 168 hours. BNP (last 3 results) No results for input(s): PROBNP in the last 8760 hours. HbA1C: Recent Labs    10/13/19 0655  HGBA1C 6.4*   CBG: Recent Labs  Lab 10/13/19 2135 10/14/19 0747 10/14/19 0807 10/14/19 0910 10/14/19 1132  GLUCAP 89 60* 69* 160* 87   Lipid Profile: No results for input(s): CHOL, HDL, LDLCALC, TRIG, CHOLHDL, LDLDIRECT in the last 72 hours. Thyroid Function Tests: No results for input(s): TSH, T4TOTAL, FREET4, T3FREE, THYROIDAB in the last 72 hours. Anemia Panel: No results for input(s): VITAMINB12, FOLATE, FERRITIN, TIBC, IRON, RETICCTPCT in the last 72 hours. Sepsis Labs: Recent Labs  Lab 10/13/19 1639 10/13/19 1923  LATICACIDVEN 3.0* 2.0*    Recent Results (from the past 240 hour(s))  Novel Coronavirus, NAA ([facility])     Status: Abnormal   Collection Time: 10/10/19  4:28 PM   Specimen: Nasopharyngeal(NP) swabs in vial transport medium   Nasopharynge  Is this  Result Value Ref Range Status   SARS-CoV-2, NAA Detected (A) Not Detected Final    Comment: Patients who have a positive COVID-19 test result may now have treatment options. Treatment options are available for patients with mild to moderate symptoms and  for hospitalized patients. Visit our website at [email or web address] for resources and information. This nucleic acid amplification test was developed and its performance characteristics determined by [employer]. Nucleic acid amplification tests include RT-PCR and TMA. This test has not been FDA cleared or approved. This test has been authorized by FDA under an Emergency Use Authorization (EUA). This test is only authorized for the duration of time the declaration that circumstances exist justifying the authorization of the emergency use of in vitro diagnostic tests for detection of SARS-CoV-2 virus and/or diagnosis of COVID-19 infection under section 564(b)(1) of the Act, 21 U.S.C. 010XNA-3(F) (1), unless the authorization is terminated or revoked sooner. When diagnostic testing is negativ e, the possibility of a false negative result should be considered in the context of a patient's recent exposures and the presence of clinical signs and symptoms consistent with COVID-19. An individual without symptoms of COVID-19 and who is not shedding SARS-CoV-2 virus would expect to have a negative (not detected) result in this assay.   SARS-COV-2, NAA 2 DAY TAT     Status: None   Collection Time: 10/10/19  4:28 PM   Nasopharynge  Is this  Result Value Ref Range Status   SARS-CoV-2, NAA 2 DAY TAT Performed  Final  Urine Culture     Status: None   Collection Time: 10/12/19 12:18 AM   Specimen: Urine, Random  Result Value Ref Range Status   Specimen Description   Final    URINE, RANDOM Performed at [facility], [address] [place], [place] [address]    Special Requests   Final    NONE Performed at [facility], [address], [place], [place] [address]    Culture   Final    NO GROWTH Performed at [facility], [address], [place], [place] [address]    Report Status 10/14/2019 FINAL  Final  CULTURE, BLOOD (ROUTINE X 2) w Reflex to  ID Panel     Status: None (Preliminary result)   Collection Time: 10/13/19  4:31 PM   Specimen: BLOOD  Result Value Ref Range Status   Specimen Description BLOOD RIGHT ANTECUBITAL  Final   Special Requests   Final    BOTTLES DRAWN AEROBIC  AND ANAEROBIC Blood Culture adequate volume   Culture   Final    NO GROWTH < 12 HOURS Performed at [facility], [address] [place], [place] [address]    Report Status PENDING  Incomplete  CULTURE, BLOOD (ROUTINE X 2) w Reflex to ID Panel     Status: None (Preliminary result)   Collection Time: 10/13/19  4:36 PM   Specimen: BLOOD  Result Value Ref Range Status   Specimen Description BLOOD LEFT ANTECUBITAL  Final   Special Requests   Final    BOTTLES DRAWN AEROBIC AND ANAEROBIC Blood Culture results may not be optimal due to an excessive volume of blood received in culture bottles   Culture   Final    NO GROWTH < 12 HOURS Performed at [facility], [address] [place], [place] [address]    Report Status PENDING  Incomplete         Radiology Studies: DG Abd 1 View  Result Date: 10/13/2019 CLINICAL DATA:  Constipation EXAM: ABDOMEN - 1 VIEW COMPARISON:  None. FINDINGS: Diffusely gas-filled, nondistended small bowel and colon with gas present to the rectum. There is scattered stool throughout the colon. No free air on supine radiographs. IMPRESSION: Diffusely gas-filled, nondistended small bowel and colon with gas present to the rectum. There is scattered stool throughout the colon. Electronically Signed   By: Lauralyn PrimesAlex  Bibbey M.D.   On: 10/13/2019 09:16        Scheduled Meds: . amLODipine  10 mg Oral Daily  . enoxaparin (LOVENOX) injection  40 mg Subcutaneous Q24H  . folic acid  1 mg Oral Daily  . insulin aspart  0-15 Units Subcutaneous TID WC  . insulin aspart  0-5 Units Subcutaneous QHS  . mirtazapine  15 mg Oral QHS  . multivitamin with minerals  1 tablet Oral Daily  . simethicone  80 mg Oral QID    Continuous Infusions: . sodium chloride    . acetaminophen 1,000 mg (10/13/19 1645)  . cefTRIAXone (ROCEPHIN)  IV 1 g (10/13/19 1700)  . dextrose 50 mL/hr at 10/14/19 0815  . famotidine (PEPCID) IV    . thiamine injection 500 mg (10/14/19 1201)     LOS: 1 day    Time spent: 25 minutes    Tresa MooreSudheer B Starlett Pehrson, MD [facility] Pager [phone]  If 7PM-7AM, please contact night-coverage 10/14/2019, 12:55 PM

## 2019-10-15 DIAGNOSIS — E43 Unspecified severe protein-calorie malnutrition: Secondary | ICD-10-CM | POA: Insufficient documentation

## 2019-10-15 DIAGNOSIS — N179 Acute kidney failure, unspecified: Secondary | ICD-10-CM | POA: Diagnosis not present

## 2019-10-15 LAB — GLUCOSE, CAPILLARY
Glucose-Capillary: 64 mg/dL — ABNORMAL LOW (ref 70–99)
Glucose-Capillary: 107 mg/dL — ABNORMAL HIGH (ref 70–99)
Glucose-Capillary: 86 mg/dL (ref 70–99)
Glucose-Capillary: 79 mg/dL (ref 70–99)
Glucose-Capillary: 100 mg/dL — ABNORMAL HIGH (ref 70–99)

## 2019-10-15 LAB — CBC WITH DIFFERENTIAL/PLATELET
Abs Immature Granulocytes: 0.03 10*3/uL (ref 0.00–0.07)
Basophils Absolute: 0 10*3/uL (ref 0.0–0.1)
Basophils Relative: 0 %
Eosinophils Absolute: 0.1 10*3/uL (ref 0.0–0.5)
Eosinophils Relative: 1 %
HCT: 40.2 % (ref 39.0–52.0)
Hemoglobin: 13.3 g/dL (ref 13.0–17.0)
Immature Granulocytes: 1 %
Lymphocytes Relative: 18 %
Lymphs Abs: 0.9 10*3/uL (ref 0.7–4.0)
MCH: 27.4 pg (ref 26.0–34.0)
MCHC: 33.1 g/dL (ref 30.0–36.0)
MCV: 82.7 fL (ref 80.0–100.0)
Monocytes Absolute: 0.3 10*3/uL (ref 0.1–1.0)
Monocytes Relative: 6 %
Neutro Abs: 3.9 10*3/uL (ref 1.7–7.7)
Neutrophils Relative %: 74 %
Platelets: 175 10*3/uL (ref 150–400)
RBC: 4.86 MIL/uL (ref 4.22–5.81)
RDW: 13.5 % (ref 11.5–15.5)
WBC: 5.2 10*3/uL (ref 4.0–10.5)
nRBC: 0 % (ref 0.0–0.2)

## 2019-10-15 LAB — COMPREHENSIVE METABOLIC PANEL
ALT: 31 U/L (ref 0–44)
AST: 57 U/L — ABNORMAL HIGH (ref 15–41)
Albumin: 3.3 g/dL — ABNORMAL LOW (ref 3.5–5.0)
Alkaline Phosphatase: 45 U/L (ref 38–126)
Anion gap: 8 (ref 5–15)
BUN: 20 mg/dL (ref 8–23)
CO2: 26 mmol/L (ref 22–32)
Calcium: 9.1 mg/dL (ref 8.9–10.3)
Chloride: 112 mmol/L — ABNORMAL HIGH (ref 98–111)
Creatinine, Ser: 1.49 mg/dL — ABNORMAL HIGH (ref 0.61–1.24)
GFR, Estimated: 43 mL/min — ABNORMAL LOW (ref >60)
Glucose, Bld: 85 mg/dL (ref 70–99)
Potassium: 3.9 mmol/L (ref 3.5–5.1)
Sodium: 146 mmol/L — ABNORMAL HIGH (ref 135–145)
Total Bilirubin: 0.8 mg/dL (ref 0.3–1.2)
Total Protein: 7.3 g/dL (ref 6.5–8.1)

## 2019-10-15 MED ORDER — HYDRALAZINE HCL 20 MG/ML IJ SOLN: 10.0000 mg | INTRAMUSCULAR | Status: DC | PRN

## 2019-10-15 MED ORDER — DEXTROSE-NACL 5-0.45 % IV SOLN: INTRAVENOUS | Status: DC

## 2019-10-15 NOTE — Progress Notes (Signed)
PROGRESS NOTE    Ernest Lewis  EPP:[id number] DOB: October 28, 1940 DOA: 10/12/2019 PCP: Malva Limes, MD   Brief Narrative:  Ernest Lewis is a 79 y.o. male with medical history significant for type 2 diabetes, hypertension, CKD stage IIIa, history of GI bleed and GERD who presents with concerns of decreased p.o. intake.  Patient only alert and oriented to self and place and was unable to provide any history about why he is here in the ED.  No family at bedside.  History obtained entirely through ED documentation ED physician.  Apparently patient was diagnosed with Covid about 2 days ago and has had significant decreased p.o. intake with fever.  Reportedly patient has also had increased confusion for the past 2 months and family has been awaiting outpatient work-up for this.  Patient incidentally was found to be Covid positive.  No oxygen requirement or pneumonia noted on chest x-ray.  Monoclonal antibody infusion ordered and administered.  Long conversation with patient's daughter via phone.  Patient has had functional decline over the past several months.  Forgetfulness, poor p.o. intake, poor self-care, medication nonadherence.  Patient lives alone and has children living locally but no one that can provide him round-the-clock care.  Therapy and TOC evaluated patient determined him not appropriate for skilled nursing facility at time of discharge.   Assessment & Plan:   Principal Problem:   AKI (acute kidney injury) (HCC) Active Problems:   COVID-19 virus infection   Cognitive impairment   Protein-calorie malnutrition, severe  Acute metabolic encephalopathy Unclear etiology.  Patient became very agitated and decreased level consciousness on 10/13/2019.  Received IM Haldol with good result.  Patient is more awake this morning.  Baseline level of mentation is unclear. Plan: Continue one-to-one sitter for now.  Will need to discontinue sitters 24 hours prior to any discharge to  skilled nursing facility.   As needed IM Haldol Delirium precautions  Hypernatremia Likely secondary to poor p.o. intake D5 half-normal saline   AKI  Suspect secondary to dehydration/volume depletion Has received 1 L of normal saline fluid in the ED.   Creatinine improved over interval Approaching baseline Plan: D5 half-normal as above Avoid nephrotoxic agent  ? UTI UA shows many bacteria, few WBC and negative nitrite and leukocyte.  He was started on IV Rocephin in the ED.   Urinalysis negative Blood cultures pending We will continue antibiotics for now.  Plan for empiric 5-day course  COVID-19 infection No signs of any respiratory distress or significant GI symptoms.   patient agreeable to monoclonal antibody Fever T-max 103 on 10/13/2019.  No fever since Plan: Received monoclonal antibody infusion Monitor fever curve and vitals  Cognitive impairment Poor p.o. intake Functional decline Reportedly has been ongoing for several months.   No focal neurological deficits.   Daughter was concerned about patient's ability to care for himself Does not eat food nor take medications regularly Plan: Continue Remeron 15 mg nightly PT and OT consults, recommend SNF Nutrition consult TOC consult Patient will need to be sitter free for 24 hours prior to discharge to SNF   DVT prophylaxis: Lovenox Code Status: Full Family Communication: Daughter [name] [phone] on 10/15/2019 Disposition Plan: Status is: Inpatient  Remains inpatient appropriate because:Altered mental status and Inpatient level of care appropriate due to severity of illness   Dispo: The patient is from: Home              Anticipated d/c is to: SNF  Anticipated d/c date is: 1 day              Patient currently is not medically stable to d/c.   Rather unclear presentation.  Patient with Covid positivity however no evidence of pneumonia.  Had fever of T-max 103 on 07/13/2019.  Mental  status is also been waxing and waning.  P.o. intake is also been very poor.  Would like some improvement in p.o. tolerance and return to baseline level of mentation as well as at least 24 hours fever free prior to disposition planning.  Consultants:   None  Procedures:   None  Antimicrobials:   None   Subjective: Seen and examined.  No specific complaints.  Confused  Objective: Vitals:   10/14/19 1923 10/15/19 0004 10/15/19 0358 10/15/19 0739  BP: (!) 161/82 (!) 153/92 (!) 188/173 (!) 147/96  Pulse: (!) 58 (!) 107 62 95  Resp: 17 17 16 18   Temp: 98.2 F (36.8 C) 98.3 F (36.8 C) 98.3 F (36.8 C) 98.2 F (36.8 C)  TempSrc:      SpO2: 97% 99% 97% 100%  Weight:      Height:        Intake/Output Summary (Last 24 hours) at 10/15/2019 1336 Last data filed at 10/14/2019 1604 Gross per 24 hour  Intake 480 ml  Output --  Net 480 ml   Filed Weights   10/12/19 1634  Weight: 83.9 kg    Examination:  General exam: No acute distress.  Appears frail Respiratory system: Poor respiratory effort.  Decreased at bases.  Normal work of breathing.  Room air Cardiovascular system: S1 & S2 heard, RRR. No JVD, murmurs, rubs, gallops or clicks. No pedal edema. Gastrointestinal system: Abdomen is nondistended, soft and nontender. No organomegaly or masses felt. Normal bowel sounds heard. Central nervous system: Alert to person only.  No focal deficits  extremities: Diffusely decreased power bilateral upper and lower extremities Skin: No rashes, lesions or ulcers Psychiatry: Judgement and insight appear poor. Mood & affect flattened.     Data Reviewed: I have personally reviewed following labs and imaging studies  CBC: Recent Labs  Lab 10/12/19 1637 10/13/19 0655 10/13/19 1638 10/14/19 0530 10/15/19 0557  WBC 4.8 5.9 7.2 6.5 5.2  NEUTROABS  --  4.9  --  5.4 3.9  HGB 12.8* 13.1 12.7* 12.1* 13.3  HCT 38.5* 41.3 40.0 38.3* 40.2  MCV 80.0 83.1 82.6 84.0 82.7  PLT 132* 143*  150 145* 175   Basic Metabolic Panel: Recent Labs  Lab 10/12/19 1637 10/13/19 0655 10/13/19 1638 10/14/19 0530 10/14/19 1500 10/15/19 0557  NA 147* 149*  --  154* 149* 146*  K 4.0 4.4  --  4.8  --  3.9  CL 110 114*  --  117*  --  112*  CO2 28 24  --  27  --  26  GLUCOSE 111* 108*  --  91  --  85  BUN 35* 29*  --  26*  --  20  CREATININE 2.26* 1.90*  --  1.56*  --  1.49*  CALCIUM 8.9 8.9  --  8.8*  --  9.1  MG  --   --  2.5*  --   --   --   PHOS  --   --  2.7  --   --   --    GFR: Estimated Creatinine Clearance: 41 mL/min (A) (by C-G formula based on SCr of 1.49 mg/dL (H)). Liver Function Tests: Recent Labs  Lab 10/12/19 1637 10/13/19 0655 10/14/19 0530 10/15/19 0557  AST 54* 54* 49* 57*  ALT 28 30 27 31   ALKPHOS 42 42 38 45  BILITOT 0.7 0.7 0.8 0.8  PROT 7.5 7.3 6.9 7.3  ALBUMIN 3.6 3.6 3.0* 3.3*   No results for input(s): LIPASE, AMYLASE in the last 168 hours. No results for input(s): AMMONIA in the last 168 hours. Coagulation Profile: No results for input(s): INR, PROTIME in the last 168 hours. Cardiac Enzymes: No results for input(s): CKTOTAL, CKMB, CKMBINDEX, TROPONINI in the last 168 hours. BNP (last 3 results) No results for input(s): PROBNP in the last 8760 hours. HbA1C: Recent Labs    10/13/19 0655  HGBA1C 6.4*   CBG: Recent Labs  Lab 10/14/19 1132 10/14/19 1637 10/14/19 1728 10/14/19 2025 10/15/19 0722  GLUCAP 87 68* 99 84 64*   Lipid Profile: No results for input(s): CHOL, HDL, LDLCALC, TRIG, CHOLHDL, LDLDIRECT in the last 72 hours. Thyroid Function Tests: No results for input(s): TSH, T4TOTAL, FREET4, T3FREE, THYROIDAB in the last 72 hours. Anemia Panel: No results for input(s): VITAMINB12, FOLATE, FERRITIN, TIBC, IRON, RETICCTPCT in the last 72 hours. Sepsis Labs: Recent Labs  Lab 10/13/19 1639 10/13/19 1923  LATICACIDVEN 3.0* 2.0*    Recent Results (from the past 240 hour(s))  Novel Coronavirus, NAA ([facility])     Status:  Abnormal   Collection Time: 10/10/19  4:28 PM   Specimen: Nasopharyngeal(NP) swabs in vial transport medium   Nasopharynge  Is this  Result Value Ref Range Status   SARS-CoV-2, NAA Detected (A) Not Detected Final    Comment: Patients who have a positive COVID-19 test result may now have treatment options. Treatment options are available for patients with mild to moderate symptoms and for hospitalized patients. Visit our website at [email or web address] for resources and information. This nucleic acid amplification test was developed and its performance characteristics determined by [facility]. Nucleic acid amplification tests include RT-PCR and TMA. This test has not been FDA cleared or approved. This test has been authorized by FDA under an Emergency Use Authorization (EUA). This test is only authorized for the duration of time the declaration that circumstances exist justifying the authorization of the emergency use of in vitro diagnostic tests for detection of SARS-CoV-2 virus and/or diagnosis of COVID-19 infection under section 564(b)(1) of the Act, 21 U.S.C. 161WRU-0(A360bbb-3(b) (1), unless the authorization is terminated or revoked sooner. When diagnostic testing is negativ e, the possibility of a false negative result should be considered in the context of a patient's recent exposures and the presence of clinical signs and symptoms consistent with COVID-19. An individual without symptoms of COVID-19 and who is not shedding SARS-CoV-2 virus would expect to have a negative (not detected) result in this assay.   SARS-COV-2, NAA 2 DAY TAT     Status: None   Collection Time: 10/10/19  4:28 PM   Nasopharynge  Is this  Result Value Ref Range Status   SARS-CoV-2, NAA 2 DAY TAT Performed  Final  Urine Culture     Status: None   Collection Time: 10/12/19 12:18 AM   Specimen: Urine, Random  Result Value Ref Range Status   Specimen Description   Final    URINE,  RANDOM Performed at [facility], [address] [place], [place] [address]    Special Requests   Final    NONE Performed at [facility], [address] [place], [place] [address]    Culture   Final  NO GROWTH Performed at [facility], [address], [place], [place] [address]    Report Status 10/14/2019 FINAL  Final  CULTURE, BLOOD (ROUTINE X 2) w Reflex to ID Panel     Status: None (Preliminary result)   Collection Time: 10/13/19  4:31 PM   Specimen: BLOOD  Result Value Ref Range Status   Specimen Description BLOOD RIGHT ANTECUBITAL  Final   Special Requests   Final    BOTTLES DRAWN AEROBIC AND ANAEROBIC Blood Culture adequate volume   Culture   Final    NO GROWTH < 12 HOURS Performed at [facility], [address] [place], [place] [address]    Report Status PENDING  Incomplete  CULTURE, BLOOD (ROUTINE X 2) w Reflex to ID Panel     Status: None (Preliminary result)   Collection Time: 10/13/19  4:36 PM   Specimen: BLOOD  Result Value Ref Range Status   Specimen Description BLOOD LEFT ANTECUBITAL  Final   Special Requests   Final    BOTTLES DRAWN AEROBIC AND ANAEROBIC Blood Culture results may not be optimal due to an excessive volume of blood received in culture bottles   Culture   Final    NO GROWTH < 12 HOURS Performed at [facility], [address], [place], [place] [address]    Report Status PENDING  Incomplete         Radiology Studies: No results found.      Scheduled Meds: . amLODipine  10 mg Oral Daily  . enoxaparin (LOVENOX) injection  40 mg Subcutaneous Q24H  . feeding supplement (ENSURE ENLIVE)  237 mL Oral TID BM  . folic acid  1 mg Oral Daily  . insulin aspart  0-15 Units Subcutaneous TID WC  . insulin aspart  0-5 Units Subcutaneous QHS  . mirtazapine  15 mg Oral QHS  . multivitamin with minerals  1 tablet Oral Daily  . simethicone  80 mg Oral QID   Continuous Infusions: . sodium  chloride    . cefTRIAXone (ROCEPHIN)  IV Stopped (10/15/19 0052)  . dextrose 5 % and 0.45% NaCl    . famotidine (PEPCID) IV    . thiamine injection 500 mg (10/15/19 1219)     LOS: 2 days    Time spent: 25 minutes    Tresa Moore, MD [facility] Pager [phone]  If 7PM-7AM, please contact night-coverage 10/15/2019, 1:36 PM

## 2019-10-16 DIAGNOSIS — N179 Acute kidney failure, unspecified: Secondary | ICD-10-CM | POA: Diagnosis not present

## 2019-10-16 LAB — COMPREHENSIVE METABOLIC PANEL
ALT: 32 U/L (ref 0–44)
AST: 49 U/L — ABNORMAL HIGH (ref 15–41)
Albumin: 3.1 g/dL — ABNORMAL LOW (ref 3.5–5.0)
Alkaline Phosphatase: 45 U/L (ref 38–126)
Anion gap: 8 (ref 5–15)
BUN: 17 mg/dL (ref 8–23)
CO2: 27 mmol/L (ref 22–32)
Calcium: 9 mg/dL (ref 8.9–10.3)
Chloride: 113 mmol/L — ABNORMAL HIGH (ref 98–111)
Creatinine, Ser: 1.5 mg/dL — ABNORMAL HIGH (ref 0.61–1.24)
GFR, Estimated: 43 mL/min — ABNORMAL LOW (ref >60)
Glucose, Bld: 103 mg/dL — ABNORMAL HIGH (ref 70–99)
Potassium: 4 mmol/L (ref 3.5–5.1)
Sodium: 148 mmol/L — ABNORMAL HIGH (ref 135–145)
Total Bilirubin: 0.8 mg/dL (ref 0.3–1.2)
Total Protein: 7.4 g/dL (ref 6.5–8.1)

## 2019-10-16 LAB — CBC WITH DIFFERENTIAL/PLATELET
Abs Immature Granulocytes: 0.02 10*3/uL (ref 0.00–0.07)
Basophils Absolute: 0 10*3/uL (ref 0.0–0.1)
Basophils Relative: 0 %
Eosinophils Absolute: 0.1 10*3/uL (ref 0.0–0.5)
Eosinophils Relative: 1 %
HCT: 42.1 % (ref 39.0–52.0)
Hemoglobin: 13.4 g/dL (ref 13.0–17.0)
Immature Granulocytes: 0 %
Lymphocytes Relative: 17 %
Lymphs Abs: 0.9 10*3/uL (ref 0.7–4.0)
MCH: 26 pg (ref 26.0–34.0)
MCHC: 31.8 g/dL (ref 30.0–36.0)
MCV: 81.6 fL (ref 80.0–100.0)
Monocytes Absolute: 0.3 10*3/uL (ref 0.1–1.0)
Monocytes Relative: 6 %
Neutro Abs: 4.1 10*3/uL (ref 1.7–7.7)
Neutrophils Relative %: 76 %
Platelets: 195 10*3/uL (ref 150–400)
RBC: 5.16 MIL/uL (ref 4.22–5.81)
RDW: 13.2 % (ref 11.5–15.5)
WBC: 5.4 10*3/uL (ref 4.0–10.5)
nRBC: 0 % (ref 0.0–0.2)

## 2019-10-16 LAB — GLUCOSE, CAPILLARY
Glucose-Capillary: 112 mg/dL — ABNORMAL HIGH (ref 70–99)
Glucose-Capillary: 115 mg/dL — ABNORMAL HIGH (ref 70–99)
Glucose-Capillary: 114 mg/dL — ABNORMAL HIGH (ref 70–99)
Glucose-Capillary: 106 mg/dL — ABNORMAL HIGH (ref 70–99)

## 2019-10-16 MED ORDER — DEXTROSE 5 % IV SOLN: INTRAVENOUS | Status: DC

## 2019-10-16 NOTE — Progress Notes (Signed)
PROGRESS NOTE    Ernest Lewis  PNT:[id number] DOB: 20-Dec-1940 DOA: 10/12/2019 PCP: Malva Limes, MD   Brief Narrative:  Ernest Lewis is a 79 y.o. male with medical history significant for type 2 diabetes, hypertension, CKD stage IIIa, history of GI bleed and GERD who presents with concerns of decreased p.o. intake.  Patient only alert and oriented to self and place and was unable to provide any history about why he is here in the ED.  No family at bedside.  History obtained entirely through ED documentation ED physician.  Apparently patient was diagnosed with Covid about 2 days ago and has had significant decreased p.o. intake with fever.  Reportedly patient has also had increased confusion for the past 2 months and family has been awaiting outpatient work-up for this.  Patient incidentally was found to be Covid positive.  No oxygen requirement or pneumonia noted on chest x-ray.  Monoclonal antibody infusion ordered and administered.  Long conversation with patient's daughter via phone.  Patient has had functional decline over the past several months.  Forgetfulness, poor p.o. intake, poor self-care, medication nonadherence.  Patient lives alone and has children living locally but no one that can provide him round-the-clock care.  Therapy and TOC evaluated patient determined him appropriate for skilled nursing facility at time of discharge.  Mental status been waxing and waning.  A little lethargic this morning but not agitated.  No oxygen requirement   Assessment & Plan:   Principal Problem:   AKI (acute kidney injury) (HCC) Active Problems:   COVID-19 virus infection   Cognitive impairment   Protein-calorie malnutrition, severe  Acute metabolic encephalopathy Unclear etiology.  Patient became very agitated and decreased level consciousness on 10/13/2019.  Since that time mentation and p.o. intake is slowly started to improve.  Baseline level of mentation is unclear.  Per his  daughter patient lives independently and usually takes care of himself however his ability to care for himself has been declining over the past several months.  Plan to discharge to skilled nursing facility Plan: [facility] has been discontinued As needed IM Haldol Delirium precautions  Hypernatremia Likely secondary to poor p.o. intake Change fluids to D5 water   AKI  Suspect secondary to dehydration/volume depletion Has received 1 L of normal saline fluid in the ED.   Creatinine improved over interval Approaching baseline Plan: D5 water Avoid nephrotoxic agent  ? UTI UA shows many bacteria, few WBC and negative nitrite and leukocyte.  He was started on IV Rocephin in the ED.   Urinalysis negative Blood cultures no growth to date We will continue antibiotics for now.  Plan for empiric 5-day course  COVID-19 infection No signs of any respiratory distress or significant GI symptoms.   patient agreeable to monoclonal antibody Fever T-max 103 on 10/13/2019.  No fever since Plan: Received monoclonal antibody infusion Monitor fever curve and vitals  Cognitive impairment Poor p.o. intake Functional decline Reportedly has been ongoing for several months.   No focal neurological deficits.   Daughter was concerned about patient's ability to care for himself Does not eat food nor take medications regularly Plan: Continue Remeron 15 mg nightly PT and OT consults, recommend SNF Nutrition consult [facility] consult Patient will likely be medically stable for discharge on 10/17/2019   DVT prophylaxis: Lovenox Code Status: Full Family Communication: Daughter Ernest Lewis [phone] on 10/15/2019 Disposition Plan: Status is: Inpatient  Remains inpatient appropriate because:Altered mental status and Inpatient level of care appropriate due to  severity of illness   Dispo: The patient is from: Home              Anticipated d/c is to: SNF              Anticipated d/c date is: 1 day               Patient currently is not medically stable to d/c.   Rather unclear presentation.  Patient with Covid positivity however no evidence of pneumonia.  Had fever of T-max 103 on 07/13/2019.  Mental status is also been waxing and waning.  P.o. intake has been poor.  Elevation in serum sodium level likely due to volume depletion and dehydration.  Placed on D5 water.  Patient sodium level improves tomorrow he will likely be medically stable for discharge at that time.  Tentative plan to discharge to skilled nursing facility on 10/17/2019 if bed available.  Consultants:   None  Procedures:   None  Antimicrobials:   None   Subjective: Seen and examined.  No specific complaints.  Confused  Objective: Vitals:   10/15/19 2320 10/16/19 0443 10/16/19 0754 10/16/19 1155  BP: 124/72 122/70 130/62 123/66  Pulse: 84 76 (!) 57 69  Resp: 17 17 18 20   Temp: 99.1 F (37.3 C) 98.5 F (36.9 C) 98.6 F (37 C) 98.6 F (37 C)  TempSrc: Oral Oral    SpO2: 100% 91% 99% 97%  Weight:      Height:       No intake or output data in the 24 hours ending 10/16/19 1358 Filed Weights   10/12/19 1634  Weight: 83.9 kg    Examination:  General exam: No acute distress.  Appears frail Respiratory system: Poor respiratory effort.  Decreased at bases.  Normal work of breathing.  Room air Cardiovascular system: S1 & S2 heard, RRR. No JVD, murmurs, rubs, gallops or clicks. No pedal edema. Gastrointestinal system: Abdomen is nondistended, soft and nontender. No organomegaly or masses felt. Normal bowel sounds heard. Central nervous system: Alert to person only.  No focal deficits  extremities: Diffusely decreased power bilateral upper and lower extremities Skin: No rashes, lesions or ulcers Psychiatry: Judgement and insight appear poor. Mood & affect flattened.     Data Reviewed: I have personally reviewed following labs and imaging studies  CBC: Recent Labs  Lab 10/13/19 0655 10/13/19 1638  10/14/19 0530 10/15/19 0557 10/16/19 0352  WBC 5.9 7.2 6.5 5.2 5.4  NEUTROABS 4.9  --  5.4 3.9 4.1  HGB 13.1 12.7* 12.1* 13.3 13.4  HCT 41.3 40.0 38.3* 40.2 42.1  MCV 83.1 82.6 84.0 82.7 81.6  PLT 143* 150 145* 175 195   Basic Metabolic Panel: Recent Labs  Lab 10/12/19 1637 10/12/19 1637 10/13/19 0655 10/13/19 1638 10/14/19 0530 10/14/19 1500 10/15/19 0557 10/16/19 0352  NA 147*   < > 149*  --  154* 149* 146* 148*  K 4.0  --  4.4  --  4.8  --  3.9 4.0  CL 110  --  114*  --  117*  --  112* 113*  CO2 28  --  24  --  27  --  26 27  GLUCOSE 111*  --  108*  --  91  --  85 103*  BUN 35*  --  29*  --  26*  --  20 17  CREATININE 2.26*  --  1.90*  --  1.56*  --  1.49* 1.50*  CALCIUM 8.9  --  8.9  --  8.8*  --  9.1 9.0  MG  --   --   --  2.5*  --   --   --   --   PHOS  --   --   --  2.7  --   --   --   --    < > = values in this interval not displayed.   GFR: Estimated Creatinine Clearance: 40.8 mL/min (A) (by C-G formula based on SCr of 1.5 mg/dL (H)). Liver Function Tests: Recent Labs  Lab 10/12/19 1637 10/13/19 0655 10/14/19 0530 10/15/19 0557 10/16/19 0352  AST 54* 54* 49* 57* 49*  ALT 28 30 27 31  32  ALKPHOS 42 42 38 45 45  BILITOT 0.7 0.7 0.8 0.8 0.8  PROT 7.5 7.3 6.9 7.3 7.4  ALBUMIN 3.6 3.6 3.0* 3.3* 3.1*   No results for input(s): LIPASE, AMYLASE in the last 168 hours. No results for input(s): AMMONIA in the last 168 hours. Coagulation Profile: No results for input(s): INR, PROTIME in the last 168 hours. Cardiac Enzymes: No results for input(s): CKTOTAL, CKMB, CKMBINDEX, TROPONINI in the last 168 hours. BNP (last 3 results) No results for input(s): PROBNP in the last 8760 hours. HbA1C: No results for input(s): HGBA1C in the last 72 hours. CBG: Recent Labs  Lab 10/15/19 0914 10/15/19 1358 10/15/19 2034 10/16/19 0751 10/16/19 1156  GLUCAP 86 79 100* 112* 115*   Lipid Profile: No results for input(s): CHOL, HDL, LDLCALC, TRIG, CHOLHDL, LDLDIRECT in  the last 72 hours. Thyroid Function Tests: No results for input(s): TSH, T4TOTAL, FREET4, T3FREE, THYROIDAB in the last 72 hours. Anemia Panel: No results for input(s): VITAMINB12, FOLATE, FERRITIN, TIBC, IRON, RETICCTPCT in the last 72 hours. Sepsis Labs: Recent Labs  Lab 10/13/19 1639 10/13/19 1923  LATICACIDVEN 3.0* 2.0*    Recent Results (from the past 240 hour(s))  Novel Coronavirus, NAA ([facility])     Status: Abnormal   Collection Time: 10/10/19  4:28 PM   Specimen: Nasopharyngeal(NP) swabs in vial transport medium   Nasopharynge  Is this  Result Value Ref Range Status   SARS-CoV-2, NAA Detected (A) Not Detected Final    Comment: Patients who have a positive COVID-19 test result may now have treatment options. Treatment options are available for patients with mild to moderate symptoms and for hospitalized patients. Visit our website at 12/10/19 for resources and information. This nucleic acid amplification test was developed and its performance characteristics determined by [employer]. Nucleic acid amplification tests include RT-PCR and TMA. This test has not been FDA cleared or approved. This test has been authorized by FDA under an Emergency Use Authorization (EUA). This test is only authorized for the duration of time the declaration that circumstances exist justifying the authorization of the emergency use of in vitro diagnostic tests for detection of SARS-CoV-2 virus and/or diagnosis of COVID-19 infection under section 564(b)(1) of the Act, 21 U.S.C. [employer]) (1), unless the authorization is terminated or revoked sooner. When diagnostic testing is negativ e, the possibility of a false negative result should be considered in the context of a patient's recent exposures and the presence of clinical signs and symptoms consistent with COVID-19. An individual without symptoms of COVID-19 and who is not shedding SARS-CoV-2 virus would  expect to have a negative (not detected) result in this assay.   SARS-COV-2, NAA 2 DAY TAT     Status: None   Collection Time: 10/10/19  4:28 PM   Nasopharynge  Is  this  Result Value Ref Range Status   SARS-CoV-2, NAA 2 DAY TAT Performed  Final  Urine Culture     Status: None   Collection Time: 10/12/19 12:18 AM   Specimen: Urine, Random  Result Value Ref Range Status   Specimen Description   Final    URINE, RANDOM Performed at [facility], [address] [place], [place] [address]    Special Requests   Final    NONE Performed at [facility], [address], [place], [place] [address]    Culture   Final    NO GROWTH Performed at [facility], [address], [place], [place] [address]    Report Status 10/14/2019 FINAL  Final  CULTURE, BLOOD (ROUTINE X 2) w Reflex to ID Panel     Status: None (Preliminary result)   Collection Time: 10/13/19  4:31 PM   Specimen: BLOOD  Result Value Ref Range Status   Specimen Description BLOOD RIGHT ANTECUBITAL  Final   Special Requests   Final    BOTTLES DRAWN AEROBIC AND ANAEROBIC Blood Culture adequate volume   Culture   Final    NO GROWTH 3 DAYS Performed at [facility], [address], [place], [place] [address]    Report Status PENDING  Incomplete  CULTURE, BLOOD (ROUTINE X 2) w Reflex to ID Panel     Status: None (Preliminary result)   Collection Time: 10/13/19  4:36 PM   Specimen: BLOOD  Result Value Ref Range Status   Specimen Description BLOOD LEFT ANTECUBITAL  Final   Special Requests   Final    BOTTLES DRAWN AEROBIC AND ANAEROBIC Blood Culture results may not be optimal due to an excessive volume of blood received in culture bottles   Culture   Final    NO GROWTH 3 DAYS Performed at [facility], [address], [place], [place] [address]    Report Status PENDING  Incomplete         Radiology Studies: No results found.      Scheduled Meds: . amLODipine  10 mg  Oral Daily  . enoxaparin (LOVENOX) injection  40 mg Subcutaneous Q24H  . feeding supplement (ENSURE ENLIVE)  237 mL Oral TID BM  . folic acid  1 mg Oral Daily  . insulin aspart  0-15 Units Subcutaneous TID WC  . insulin aspart  0-5 Units Subcutaneous QHS  . mirtazapine  15 mg Oral QHS  . multivitamin with minerals  1 tablet Oral Daily  . simethicone  80 mg Oral QID   Continuous Infusions: . sodium chloride    . cefTRIAXone (ROCEPHIN)  IV Stopped (10/16/19 0412)  . dextrose 50 mL/hr at 10/16/19 1054  . famotidine (PEPCID) IV    . thiamine injection 500 mg (10/16/19 1109)     LOS: 3 days    Time spent: 25 minutes    Tresa Moore, MD [facility] Pager [phone]  If 7PM-7AM, please contact night-coverage 10/16/2019, 1:58 PM

## 2019-10-16 NOTE — Plan of Care (Signed)

## 2019-10-17 DIAGNOSIS — Z7189 Other specified counseling: Secondary | ICD-10-CM | POA: Diagnosis not present

## 2019-10-17 DIAGNOSIS — E43 Unspecified severe protein-calorie malnutrition: Secondary | ICD-10-CM | POA: Diagnosis not present

## 2019-10-17 DIAGNOSIS — U071 COVID-19: Secondary | ICD-10-CM | POA: Diagnosis not present

## 2019-10-17 DIAGNOSIS — N179 Acute kidney failure, unspecified: Secondary | ICD-10-CM | POA: Diagnosis not present

## 2019-10-17 DIAGNOSIS — Z515 Encounter for palliative care: Secondary | ICD-10-CM

## 2019-10-17 LAB — COMPREHENSIVE METABOLIC PANEL
ALT: 39 U/L (ref 0–44)
AST: 50 U/L — ABNORMAL HIGH (ref 15–41)
Albumin: 2.8 g/dL — ABNORMAL LOW (ref 3.5–5.0)
Alkaline Phosphatase: 42 U/L (ref 38–126)
Anion gap: 9 (ref 5–15)
BUN: 19 mg/dL (ref 8–23)
CO2: 26 mmol/L (ref 22–32)
Calcium: 8.6 mg/dL — ABNORMAL LOW (ref 8.9–10.3)
Chloride: 114 mmol/L — ABNORMAL HIGH (ref 98–111)
Creatinine, Ser: 1.65 mg/dL — ABNORMAL HIGH (ref 0.61–1.24)
GFR, Estimated: 38 mL/min — ABNORMAL LOW (ref >60)
Glucose, Bld: 101 mg/dL — ABNORMAL HIGH (ref 70–99)
Potassium: 3.6 mmol/L (ref 3.5–5.1)
Sodium: 149 mmol/L — ABNORMAL HIGH (ref 135–145)
Total Bilirubin: 0.8 mg/dL (ref 0.3–1.2)
Total Protein: 6.6 g/dL (ref 6.5–8.1)

## 2019-10-17 LAB — CBC WITH DIFFERENTIAL/PLATELET
Abs Immature Granulocytes: 0.03 K/uL (ref 0.00–0.07)
Basophils Absolute: 0 K/uL (ref 0.0–0.1)
Basophils Relative: 0 %
Eosinophils Absolute: 0.1 K/uL (ref 0.0–0.5)
Eosinophils Relative: 2 %
HCT: 39.2 % (ref 39.0–52.0)
Hemoglobin: 12.5 g/dL — ABNORMAL LOW (ref 13.0–17.0)
Immature Granulocytes: 1 %
Lymphocytes Relative: 15 %
Lymphs Abs: 0.7 K/uL (ref 0.7–4.0)
MCH: 26.3 pg (ref 26.0–34.0)
MCHC: 31.9 g/dL (ref 30.0–36.0)
MCV: 82.5 fL (ref 80.0–100.0)
Monocytes Absolute: 0.3 K/uL (ref 0.1–1.0)
Monocytes Relative: 5 %
Neutro Abs: 3.9 K/uL (ref 1.7–7.7)
Neutrophils Relative %: 77 %
Platelets: 202 K/uL (ref 150–400)
RBC: 4.75 MIL/uL (ref 4.22–5.81)
RDW: 13.2 % (ref 11.5–15.5)
WBC: 5 K/uL (ref 4.0–10.5)
nRBC: 0 % (ref 0.0–0.2)

## 2019-10-17 LAB — GLUCOSE, CAPILLARY
Glucose-Capillary: 106 mg/dL — ABNORMAL HIGH (ref 70–99)
Glucose-Capillary: 92 mg/dL (ref 70–99)
Glucose-Capillary: 79 mg/dL (ref 70–99)
Glucose-Capillary: 90 mg/dL (ref 70–99)

## 2019-10-17 MED ORDER — HALOPERIDOL LACTATE 5 MG/ML IJ SOLN
2.0000 mg | Freq: Four times a day (QID) | INTRAMUSCULAR | Status: DC | PRN
  Administered 2019-10-17 – 2019-10-18 (×2): 2 mg via INTRAVENOUS
  Filled 2019-10-17: qty 1

## 2019-10-17 NOTE — Care Management Important Message (Signed)
Important Message  [name] MRN: [id number] Date of Birth: 1940/06/23   Medicare Important Message Given:  Yes  Reviewed with patient's daughter, [name].  Copy of Medicare IM sent securely to email address provided once verifying: [email or web address].    Johnell Comings 10/17/2019, 1:32 PM

## 2019-10-17 NOTE — Progress Notes (Signed)
   10/17/19 1247  Assess: MEWS Score  Temp 99 F (37.2 C)  BP (!) 90/54  Pulse Rate 66  Resp 20  Level of Consciousness Responds to Voice  SpO2 99 %  O2 Device Room Air  Patient Activity (if Appropriate) In bed  Assess: MEWS Score  MEWS Temp 0  MEWS Systolic 1  MEWS Pulse 0  MEWS RR 0  MEWS LOC 1  MEWS Score 2  MEWS Score Color Yellow  Assess: if the MEWS score is Yellow or Red  Were vital signs taken at a resting state? Yes  Focused Assessment Change from prior assessment (see assessment flowsheet)  Early Detection of Sepsis Score *See Row Information* Low  MEWS guidelines implemented *See Row Information* Yes  Treat  MEWS Interventions Other (Comment) (monitor patient )  Pain Scale PAINAD  Breathing 0  Negative Vocalization 0  Facial Expression 0  Body Language 0  Consolability 0  PAINAD Score 0  Take Vital Signs  Increase Vital Sign Frequency  Yellow: Q 2hr X 2 then Q 4hr X 2, if remains yellow, continue Q 4hrs  Escalate  MEWS: Escalate Yellow: discuss with charge nurse/RN and consider discussing with provider and RRT  Notify: Charge Nurse/RN  Name of Charge Nurse/RN Notified Heloise Beecham, RN  Date Charge Nurse/RN Notified 10/17/19  Time Charge Nurse/RN Notified 1256  Notify: Provider  Provider Name/Title Dr. Georgeann Oppenheim  Date Provider Notified 10/17/19  Time Provider Notified 1256  Notification Type Page (secure chat )  Notification Reason Change in status  Response No new orders  Date of Provider Response 10/17/19  Time of Provider Response 1302  Document  Patient Outcome Other (Comment) (patient is stable will continue to monitor)  Progress note created (see row info) Yes

## 2019-10-17 NOTE — Progress Notes (Signed)
Occupational Therapy Treatment Patient Details Name: Ernest Lewis MRN: [id number] DOB: 01/07/1940 Today's Date: 10/17/2019    History of present illness Pt is an 79 y.o. male presenting to hospital 10/6 with concerns of decreased p.o. intake.  (+) COVID-19.  Per notes pt with functional decline past several months; forgetfulness, poor self care, and medication nonadherence.  Pt admitted with AKI secondary to hypovolemia, ? UTI, COVID-19 infection, and cognitive impairment.  PMH includes alcohol use, carotid artery stenosis, DM, htn, CKD stage 3a, and h/o prostate surgery.   OT comments  Pt seen for OT treatment on this date. Upon arrival to room pt asleep in bed, no sitter present. Pt slow to wake up, required repeatedly shouting name, shaking shoulder, sternal rubbing. Pt denies pain today. Pt engaged in bed mobility with Min A for supine<>sit, but declined any OOB activity. Pt also declined self-feeding, showed no interest in lunch. Agitation and impulsiveness demonstrated on prior visit not displayed today. Pt continues to benefit from skilled OT services while hospitalized to maximize return to PLOF and increase functional mobility. Will continue to follow POC. Discharge recommendation remains appropriate.    Follow Up Recommendations  SNF    Equipment Recommendations       Recommendations for Other Services      Precautions / Restrictions Precautions Precautions: Fall Restrictions Weight Bearing Restrictions: No       Mobility Bed Mobility Overal bed mobility: Needs Assistance Bed Mobility: Supine to Sit;Sit to Supine     Supine to sit: Mod assist Sit to supine: Mod assist   General bed mobility comments: Pt unable to follow 1-step directions (e.g., sit down here), required strong tactile cues for re-direction  Transfers                      Balance Overall balance assessment: Needs assistance                                         ADL  either performed or assessed with clinical judgement   ADL Overall ADL's : Needs assistance/impaired                                       General ADL Comments: impulsive, unable to follow 1-step directions and VCs     Vision       Perception     Praxis      Cognition Arousal/Alertness: Lethargic Behavior During Therapy: Flat affect Overall Cognitive Status: No family/caregiver present to determine baseline cognitive functioning                                          Exercises Other Exercises Other Exercises: Bed mobility, cognitive redirection   Shoulder Instructions       General Comments      Pertinent Vitals/ Pain       Pain Assessment: No/denies pain  Home Living                                          Prior Functioning/Environment  Frequency  Min 1X/week        Progress Toward Goals  OT Goals(current goals can now be found in the care plan section)  Progress towards OT goals: Not progressing toward goals - comment (pt unable to state goals)  Acute Rehab OT Goals Patient Stated Goal: pt unable to state a goal OT Goal Formulation: Patient unable to participate in goal setting Time For Goal Achievement: 10/27/19 Potential to Achieve Goals: Fair  Plan      Co-evaluation                 AM-PAC OT "6 Clicks" Daily Activity     Outcome Measure   Help from another person eating meals?: None Help from another person taking care of personal grooming?: A Little Help from another person toileting, which includes using toliet, bedpan, or urinal?: A Lot Help from another person bathing (including washing, rinsing, drying)?: A Lot Help from another person to put on and taking off regular upper body clothing?: A Lot Help from another person to put on and taking off regular lower body clothing?: A Lot 6 Click Score: 15    End of Session    OT Visit Diagnosis: Unsteadiness on  feet (R26.81);Other abnormalities of gait and mobility (R26.89);Muscle weakness (generalized) (M62.81);Cognitive communication deficit (R41.841)   Activity Tolerance Patient limited by fatigue;Patient limited by lethargy   Patient Left in bed;with call bell/phone within reach;with bed alarm set   Nurse Communication Mobility status        Time: 1610-9604 OT Time Calculation (min): 15 min  Charges: OT General Charges $OT Visit: 1 Visit OT Treatments $Self Care/Home Management : 8-22 mins  Latina Craver, PhD, MS, OTR/L ascom [phone] 10/17/19, 12:32 PM

## 2019-10-17 NOTE — Progress Notes (Signed)
PROGRESS NOTE    Ernest Lewis  QHU:[id number] DOB: 1940/08/19 DOA: 10/12/2019 PCP: Malva Limes, MD   Brief Narrative:  Ernest Lewis is a 79 y.o. male with medical history significant for type 2 diabetes, hypertension, CKD stage IIIa, history of GI bleed and GERD who presents with concerns of decreased p.o. intake.  Patient only alert and oriented to self and place and was unable to provide any history about why he is here in the ED.  No family at bedside.  History obtained entirely through ED documentation ED physician.  Apparently patient was diagnosed with Covid about 2 days ago and has had significant decreased p.o. intake with fever.  Reportedly patient has also had increased confusion for the past 2 months and family has been awaiting outpatient work-up for this.  Patient incidentally was found to be Covid positive.  No oxygen requirement or pneumonia noted on chest x-ray.  Monoclonal antibody infusion ordered and administered.  Long conversation with patient's daughter via phone.  Patient has had functional decline over the past several months.  Forgetfulness, poor p.o. intake, poor self-care, medication nonadherence.  Patient lives alone and has children living locally but no one that can provide him round-the-clock care.  Therapy and TOC evaluated patient determined him appropriate for skilled nursing facility at time of discharge.  Mental status been waxing and waning.  A little lethargic this morning but not agitated.  No oxygen requirement   Assessment & Plan:   Principal Problem:   AKI (acute kidney injury) (HCC) Active Problems:   COVID-19 virus infection   Cognitive impairment   Protein-calorie malnutrition, severe  Acute metabolic encephalopathy Unclear etiology.  Patient became very agitated and decreased level consciousness on 10/13/2019.  Since that time mentation and p.o. intake is slowly started to improve.  Baseline level of mentation is unclear.  Per his  daughter patient lives independently and usually takes care of himself however his ability to care for himself has been declining over the past several months.  Plan to discharge to skilled nursing facility Plan: [facility] has been discontinued As needed IM Haldol Delirium precautions  Hypernatremia Likely secondary to poor p.o. intake Worsening over interval Increase rate of D5 water Recheck sodium in a.m.   AKI  Suspect secondary to dehydration/volume depletion Has received 1 L of normal saline fluid in the ED.   Creatinine improved over interval Approaching baseline Plan: D5 water Avoid nephrotoxic agent  ? UTI UA shows many bacteria, few WBC and negative nitrite and leukocyte.  He was started on IV Rocephin in the ED.   Urinalysis negative Blood cultures no growth to date We will continue antibiotics for now.  Plan for empiric 5-day course  COVID-19 infection No signs of any respiratory distress or significant GI symptoms.   patient agreeable to monoclonal antibody Fever T-max 103 on 10/13/2019.  No fever since Plan: Received monoclonal antibody infusion Monitor fever curve and vitals  Cognitive impairment Poor p.o. intake Functional decline Reportedly has been ongoing for several months.   No focal neurological deficits.   Daughter was concerned about patient's ability to care for himself Does not eat food nor take medications regularly Plan: Continue Remeron 15 mg nightly PT and OT consults, recommend SNF Nutrition consult [facility] consult Patient will likely be medically stable for discharge on 10/18/2019   DVT prophylaxis: Lovenox Code Status: Full Family Communication: Daughter [name] [phone] on 10/17/2019 Disposition Plan: Status is: Inpatient  Remains inpatient appropriate because:Altered mental status and  Inpatient level of care appropriate due to severity of illness   Dispo: The patient is from: Home              Anticipated d/c is to:  SNF              Anticipated d/c date is: 1 day              Patient currently is not medically stable to d/c.   Rather unclear presentation.  Patient with Covid positivity however no evidence of pneumonia.  Had fever of T-max 103 on 07/13/2019.  Mental status is also been waxing and waning.  P.o. intake has been poor.  Elevation in serum sodium level likely due to volume depletion and dehydration.  Placed on D5 water.  Patient has a SNF bed available.  If sodium improves after administration of D5 water 1 plan on discharge to skilled nursing facility on 10/17/2019  Consultants:   None  Procedures:   None  Antimicrobials:   None   Subjective: Seen and examined.  No specific complaints.  Confused  Objective: Vitals:   10/16/19 2330 10/17/19 0512 10/17/19 0752 10/17/19 1247  BP: (!) 161/70 (!) 143/84 (!) 113/92 (!) 90/54  Pulse: 87 73 87 66  Resp: (!) 21 19 20 20   Temp: 98.7 F (37.1 C) 98.8 F (37.1 C) 97.8 F (36.6 C) 99 F (37.2 C)  TempSrc: Oral  Oral Oral  SpO2: 95% 100% 91% 99%  Weight:      Height:        Intake/Output Summary (Last 24 hours) at 10/17/2019 1347 Last data filed at 10/17/2019 0513 Gross per 24 hour  Intake 1333.54 ml  Output 500 ml  Net 833.54 ml   Filed Weights   10/12/19 1634  Weight: 83.9 kg    Examination:  General exam: No acute distress.  Appears frail Respiratory system: Poor respiratory effort.  Decreased at bases.  Normal work of breathing.  Room air Cardiovascular system: S1 & S2 heard, RRR. No JVD, murmurs, rubs, gallops or clicks. No pedal edema. Gastrointestinal system: Abdomen is nondistended, soft and nontender. No organomegaly or masses felt. Normal bowel sounds heard. Central nervous system: Alert to person only.  No focal deficits  extremities: Diffusely decreased power bilateral upper and lower extremities Skin: No rashes, lesions or ulcers Psychiatry: Judgement and insight appear poor. Mood & affect flattened.      Data Reviewed: I have personally reviewed following labs and imaging studies  CBC: Recent Labs  Lab 10/13/19 0655 10/13/19 0655 10/13/19 1638 10/14/19 0530 10/15/19 0557 10/16/19 0352 10/17/19 0548  WBC 5.9   < > 7.2 6.5 5.2 5.4 5.0  NEUTROABS 4.9  --   --  5.4 3.9 4.1 3.9  HGB 13.1   < > 12.7* 12.1* 13.3 13.4 12.5*  HCT 41.3   < > 40.0 38.3* 40.2 42.1 39.2  MCV 83.1   < > 82.6 84.0 82.7 81.6 82.5  PLT 143*   < > 150 145* 175 195 202   < > = values in this interval not displayed.   Basic Metabolic Panel: Recent Labs  Lab 10/13/19 0655 10/13/19 0655 10/13/19 1638 10/14/19 0530 10/14/19 1500 10/15/19 0557 10/16/19 0352 10/17/19 0548  NA 149*   < >  --  154* 149* 146* 148* 149*  K 4.4  --   --  4.8  --  3.9 4.0 3.6  CL 114*  --   --  117*  --  112*  113* 114*  CO2 24  --   --  27  --  26 27 26   GLUCOSE 108*  --   --  91  --  85 103* 101*  BUN 29*  --   --  26*  --  20 17 19   CREATININE 1.90*  --   --  1.56*  --  1.49* 1.50* 1.65*  CALCIUM 8.9  --   --  8.8*  --  9.1 9.0 8.6*  MG  --   --  2.5*  --   --   --   --   --   PHOS  --   --  2.7  --   --   --   --   --    < > = values in this interval not displayed.   GFR: Estimated Creatinine Clearance: 37 mL/min (A) (by C-G formula based on SCr of 1.65 mg/dL (H)). Liver Function Tests: Recent Labs  Lab 10/13/19 0655 10/14/19 0530 10/15/19 0557 10/16/19 0352 10/17/19 0548  AST 54* 49* 57* 49* 50*  ALT 30 27 31  32 39  ALKPHOS 42 38 45 45 42  BILITOT 0.7 0.8 0.8 0.8 0.8  PROT 7.3 6.9 7.3 7.4 6.6  ALBUMIN 3.6 3.0* 3.3* 3.1* 2.8*   No results for input(s): LIPASE, AMYLASE in the last 168 hours. No results for input(s): AMMONIA in the last 168 hours. Coagulation Profile: No results for input(s): INR, PROTIME in the last 168 hours. Cardiac Enzymes: No results for input(s): CKTOTAL, CKMB, CKMBINDEX, TROPONINI in the last 168 hours. BNP (last 3 results) No results for input(s): PROBNP in the last 8760  hours. HbA1C: No results for input(s): HGBA1C in the last 72 hours. CBG: Recent Labs  Lab 10/16/19 1156 10/16/19 1629 10/16/19 1951 10/17/19 0805 10/17/19 1131  GLUCAP 115* 114* 106* 106* 92   Lipid Profile: No results for input(s): CHOL, HDL, LDLCALC, TRIG, CHOLHDL, LDLDIRECT in the last 72 hours. Thyroid Function Tests: No results for input(s): TSH, T4TOTAL, FREET4, T3FREE, THYROIDAB in the last 72 hours. Anemia Panel: No results for input(s): VITAMINB12, FOLATE, FERRITIN, TIBC, IRON, RETICCTPCT in the last 72 hours. Sepsis Labs: Recent Labs  Lab 10/13/19 1639 10/13/19 1923  LATICACIDVEN 3.0* 2.0*    Recent Results (from the past 240 hour(s))  Novel Coronavirus, NAA ([facility])     Status: Abnormal   Collection Time: 10/10/19  4:28 PM   Specimen: Nasopharyngeal(NP) swabs in vial transport medium   Nasopharynge  Is this  Result Value Ref Range Status   SARS-CoV-2, NAA Detected (A) Not Detected Final    Comment: Patients who have a positive COVID-19 test result may now have treatment options. Treatment options are available for patients with mild to moderate symptoms and for hospitalized patients. Visit our website at [email or web address] for resources and information. This nucleic acid amplification test was developed and its performance characteristics determined by [facility]. Nucleic acid amplification tests include RT-PCR and TMA. This test has not been FDA cleared or approved. This test has been authorized by FDA under an Emergency Use Authorization (EUA). This test is only authorized for the duration of time the declaration that circumstances exist justifying the authorization of the emergency use of in vitro diagnostic tests for detection of SARS-CoV-2 virus and/or diagnosis of COVID-19 infection under section 564(b)(1) of the Act, 21 U.S.C. 409WJX-9(J360bbb-3(b) (1), unless the authorization is terminated or revoked sooner. When diagnostic testing  is negativ e, the possibility of a false negative result should  be considered in the context of a patient's recent exposures and the presence of clinical signs and symptoms consistent with COVID-19. An individual without symptoms of COVID-19 and who is not shedding SARS-CoV-2 virus would expect to have a negative (not detected) result in this assay.   SARS-COV-2, NAA 2 DAY TAT     Status: None   Collection Time: 10/10/19  4:28 PM   Nasopharynge  Is this  Result Value Ref Range Status   SARS-CoV-2, NAA 2 DAY TAT Performed  Final  Urine Culture     Status: None   Collection Time: 10/12/19 12:18 AM   Specimen: Urine, Random  Result Value Ref Range Status   Specimen Description   Final    URINE, RANDOM Performed at [facility], [address] [place], [place] [address]    Special Requests   Final    NONE Performed at [facility], [address] [place], [place] [address]    Culture   Final    NO GROWTH Performed at [facility], [address], [place], [place] [address]    Report Status 10/14/2019 FINAL  Final  CULTURE, BLOOD (ROUTINE X 2) w Reflex to ID Panel     Status: None (Preliminary result)   Collection Time: 10/13/19  4:31 PM   Specimen: BLOOD  Result Value Ref Range Status   Specimen Description BLOOD RIGHT ANTECUBITAL  Final   Special Requests   Final    BOTTLES DRAWN AEROBIC AND ANAEROBIC Blood Culture adequate volume   Culture   Final    NO GROWTH 4 DAYS Performed at [facility], [address] [place], [place] [address]    Report Status PENDING  Incomplete  CULTURE, BLOOD (ROUTINE X 2) w Reflex to ID Panel     Status: None (Preliminary result)   Collection Time: 10/13/19  4:36 PM   Specimen: BLOOD  Result Value Ref Range Status   Specimen Description BLOOD LEFT ANTECUBITAL  Final   Special Requests   Final    BOTTLES DRAWN AEROBIC AND ANAEROBIC Blood Culture results may not be optimal due to an excessive volume of  blood received in culture bottles   Culture   Final    NO GROWTH 4 DAYS Performed at [facility], [address] [place], [place] [address]    Report Status PENDING  Incomplete         Radiology Studies: No results found.      Scheduled Meds: . amLODipine  10 mg Oral Daily  . enoxaparin (LOVENOX) injection  40 mg Subcutaneous Q24H  . feeding supplement (ENSURE ENLIVE)  237 mL Oral TID BM  . folic acid  1 mg Oral Daily  . insulin aspart  0-15 Units Subcutaneous TID WC  . insulin aspart  0-5 Units Subcutaneous QHS  . mirtazapine  15 mg Oral QHS  . multivitamin with minerals  1 tablet Oral Daily  . simethicone  80 mg Oral QID   Continuous Infusions: . sodium chloride    . cefTRIAXone (ROCEPHIN)  IV Stopped (10/16/19 2002)  . dextrose 75 mL/hr at 10/17/19 0755  . famotidine (PEPCID) IV       LOS: 4 days    Time spent: 15 minutes    Tresa Moore, MD [facility] Pager [phone]  If 7PM-7AM, please contact night-coverage 10/17/2019, 1:47 PM

## 2019-10-17 NOTE — Progress Notes (Signed)
   10/17/19 1055  Clinical Encounter Type  Visited With Family  Visit Type Initial  Referral From Family  Consult/Referral To Chaplain  Chaplain responded to a telephone call she receive inquiring about an AD. Chaplain asked patient was confused or could he act on his own. Daughter, [name], said she was unsure because she has not be able to visit him. Chaplain called the  Unit and spoke with a nurse. The nurse said patient is confused because of dementia. Chaplain called daughter back and explain that we are unable to assist Pt with completing an AD. Chaplain asked if he has a wife. Daughter said he isn't married. Chaplain asked if she had siblings and she said she has a brother, Chaplain told daughter that she and her brother can tal;k to the doctors on her father's behalf.

## 2019-10-17 NOTE — Progress Notes (Signed)
PT Cancellation Note  Patient Details Name: Ernest Lewis MRN: [id number] DOB: Sep 11, 1940   Cancelled Treatment:    Reason Eval/Treat Not Completed: Other (comment) Pt is sleeping and unable to awaken with verbal and tactile stimuli. Will hold at this time and re-attempt next date/time as available and pt medically appropriate for PT tx session.   Katherine Basset, SPT Baker Pierini 10/17/2019, 1:52 PM

## 2019-10-17 NOTE — Consult Note (Signed)
Consultation Note Date: 10/17/2019   Patient Name: Ernest Lewis  DOB: 26-Jun-1940  MRN: [id number]  Age / Sex: 79 y.o., male   PCP: Malva Limes, MD Referring Physician: Tresa Moore, MD   REASON FOR CONSULTATION:Establishing goals of care  Palliative Care consult requested for goals of care discussion in this 79 y.o. male with multiple medical problems including Type 2 diabetes, hypertension, CKD stage IIIa, history of GI bleed, and GERD.  Presented to the ED from home with concerns of decreased appetite.  Patient subsequently diagnosed with Covid 2 days prior to admission.  Patient started on IV antibiotics for UTI.  Chest x-ray showed no acute findings.  Patient received monoclonal antibody infusion.  Patient continues to exhibit intermittent confusion, agitation, and poor p.o. intake.  Clinical Assessment and Goals of Care: I have reviewed medical records including lab results, imaging, Epic notes, and MAR, received report from the bedside RN, and assessed the patient. I spoke with patient's daughter [name] via phone to discuss diagnosis prognosis, GOC, EOL wishes, disposition and options.  I introduced Palliative Medicine as specialized medical care for people living with serious illness. It focuses on providing relief from the symptoms and stress of a serious illness. The goal is to improve quality of life for both the patient and the family.  Daughter verbalizes understanding and appreciation.  We discussed a brief life review of the patient, along with his functional and nutritional status.  Daughter reports patient lives alone.  He has 2 children.  Patient was recently working at [employer] where he has been employed for many years. He was most recently working up until a month ago.  Daughter reports patient is a pretty private man who valued his independency.  Prior to admission daughter reports a noticeable decline in patient's health. Ernest Lewis  reports both she and her brother have noticed significant forgetfulness, short-term memory loss, and patient would often take time to respond as he seemed to try to convince himself how to do the certain things.  She reports patient has not driven in several months.  He has not written his name or on any documents over the past 6 months as many times he has been observed somewhat forgetting how to spell/right.  Daughter reports patient fell 2 weeks prior to admission while working.  He was ambulatory in his home and performed all ADLs with some noticeable inability to perform some task without prompting over the past month.  We discussed His current illness and what it means in the larger context of His on-going co-morbidities. With specific discussions regarding his overall functional and nutritional decline.  Natural disease trajectory and expectations at EOL were discussed.  Daughter is tearful and expressing her understanding of patient's current illness and comorbidities.  She shares she and her brother were "dreading this particular moment in life".  Therapeutic listening and support provided.  Ernest Lewis expresses concerns from family that patient would no longer be able to provide independent care in the home and unfortunately family is unable to provide 24/7 care for him.  She reports patient could possibly move in with his son at some point however they are hopeful he will show some improvement and with rehabilitation be able to perform some care for himself as he was prior to a month ago.  I attempted to elicit values and goals of care important to the patient.    We discussed at length given patient's current illness and acknowledged  decline prior to admission patient may not return to baseline.  Daughter verbalized understanding and awareness patient may have a new baseline and or further continue to decline.  I created an opportunity to further discuss concepts specific to code status, artifical  feeding and hydration, and rehospitalization.  Ernest Lewis reports patient does not have a documented advanced directive.  She shares they have a completed living will however they were planning to take the document and finalized with notarized signatures with their attorney and unfortunately patient refused to go on their schedule date and since then his health has further declined.  Ernest Lewis shares patient has always been private and not interested in discussing such topics.  I discussed at length patient's full CODE STATUS with consideration of his current illness and comorbidities.  Ernest Lewis verbalizes understanding expressing again her father never wished to discuss this topic although she and her brother have approached it several times.  She reports they do not feel patient will want to live on prolong life supporting measures however family would need to discuss further before making any final decisions.  I encourage daughter to continue ongoing discussions with her brother emphasizing the best time to prepare for an emergency is before it occurs.  I also encouraged daughter as she and her brother continue with ongoing discussions to consider patient's quality of life and what would be most important to him as the center of their decisions.  Ernest Lewis verbalized understanding and appreciation.  Family is remaining hopeful patient will show some improvement/stability.  Daughter is clear in expressing goals to continue to treat the treatable while she and her brother continue ongoing discussions.  They are hopeful patient will discharge to rehabilitation and continue to have an opportunity to show some improvement, with an understanding improvement may be minimal if any.  Daughter would like to discuss further with her brother in regards to outpatient palliative support.  Questions and concerns were addressed. The family was encouraged to call with questions or concerns.  PMT will continue to support  holistically.   SOCIAL HISTORY:     reports that he has never smoked. He has never used smokeless tobacco. He reports previous alcohol use. He reports that he does not use drugs.  CODE STATUS: Full code  ADVANCE DIRECTIVES: Primary Decision Maker: Next of kin (daughter and son)  SYMPTOM MANAGEMENT: Per attending  Palliative Prophylaxis:   Delirium Protocol  PSYCHO-SOCIAL/SPIRITUAL:  Support System: Family  Desire for further Chaplaincy support: No  Additional Recommendations (Limitations, Scope, Preferences):  Full Scope Treatment  Education on hospice/palliative    PAST MEDICAL HISTORY: Past Medical History:  Diagnosis Date  . 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 Breasts asymmetrical   . Carotid artery stenosis 09/15/2008   Carotid dopplers 2010: +50% stenosis bilaterally. s/p vascular consult: recommend carotid dopplers every six months. Repeat carotid dopplers 08/2010: 50-69% stenosis R.  . Diabetes mellitus without complication (HCC) 08/31/2008   with Renal Complications  . Gynecomastia   . Hypercholesteremia 03/09/2007  . Hypertension 03/09/2007  . Hypogonadism, male     ALLERGIES:  has No Known Allergies.   MEDICATIONS:  Current Facility-Administered Medications  Medication Dose Route Frequency Provider Last Rate Last Admin  . 0.9 %  sodium chloride infusion   Intravenous PRN Tu, Ching T, DO      . acetaminophen (TYLENOL) tablet 650 mg  650 mg Oral Q6H PRN Sreenath, Sudheer B, MD      . albuterol (VENTOLIN HFA) 108 (90 Base) MCG/ACT inhaler 2 puff  2 puff Inhalation Once PRN Tu, Ching T, DO      . amLODipine (NORVASC) tablet 10 mg  10 mg Oral Daily Lolita PatellaSreenath, Sudheer B, MD   10 mg at 10/17/19 0832  . cefTRIAXone (ROCEPHIN) 1 g in sodium chloride 0.9 % 100 mL IVPB  1 g Intravenous Q24H Tresa MooreSreenath, Sudheer B, MD   Stopped at 10/16/19 2002  . dextrose 5 % solution   Intravenous Continuous Lolita PatellaSreenath, Sudheer B, MD 75 mL/hr at 10/17/19 0755 Rate Change at 10/17/19 0755  .  enoxaparin (LOVENOX) injection 40 mg  40 mg Subcutaneous Q24H Albina BilletShanlever, Charles M, RPH   40 mg at 10/16/19 2104  . EPINEPHrine (EPI-PEN) injection 0.3 mg  0.3 mg Intramuscular Once PRN Tu, Ching T, DO      . famotidine (PEPCID) IVPB 20 mg premix  20 mg Intravenous Once PRN Tu, Ching T, DO      . feeding supplement (ENSURE ENLIVE) (ENSURE ENLIVE) liquid 237 mL  237 mL Oral TID BM Lolita PatellaSreenath, Sudheer B, MD   237 mL at 10/17/19 0833  . folic acid (FOLVITE) tablet 1 mg  1 mg Oral Daily Georgeann OppenheimSreenath, Sudheer B, MD   1 mg at 10/17/19 16100833  . haloperidol lactate (HALDOL) injection 2 mg  2 mg Intramuscular Q6H PRN Lolita PatellaSreenath, Sudheer B, MD   2 mg at 10/17/19 0759  . hydrALAZINE (APRESOLINE) injection 10 mg  10 mg Intravenous Q4H PRN Sreenath, Sudheer B, MD      . insulin aspart (novoLOG) injection 0-15 Units  0-15 Units Subcutaneous TID WC Sreenath, Sudheer B, MD      . insulin aspart (novoLOG) injection 0-5 Units  0-5 Units Subcutaneous QHS Sreenath, Sudheer B, MD      . methylPREDNISolone sodium succinate (SOLU-MEDROL) 125 mg/2 mL injection 125 mg  125 mg Intravenous Once PRN Tu, Ching T, DO      . mirtazapine (REMERON) tablet 15 mg  15 mg Oral QHS Lolita PatellaSreenath, Sudheer B, MD   15 mg at 10/16/19 2104  . multivitamin with minerals tablet 1 tablet  1 tablet Oral Daily Lolita PatellaSreenath, Sudheer B, MD   1 tablet at 10/17/19 0834  . senna-docusate (Senokot-S) tablet 1 tablet  1 tablet Oral BID PRN Lolita PatellaSreenath, Sudheer B, MD      . simethicone (MYLICON) chewable tablet 80 mg  80 mg Oral QID Lolita PatellaSreenath, Sudheer B, MD   80 mg at 10/17/19 0835    VITAL SIGNS: BP (!) 90/54 (BP Location: Left Arm)   Pulse 66   Temp 99 F (37.2 C) (Oral)   Resp 20   Ht 5\' 8"  (1.727 m)   Wt 83.9 kg   SpO2 99%   BMI 28.13 kg/m  Filed Weights   10/12/19 1634  Weight: 83.9 kg    Estimated body mass index is 28.13 kg/m as calculated from the following:   Height as of this encounter: 5\' 8"  (1.727 m).   Weight as of this encounter: 83.9  kg.  LABS: CBC:    Component Value Date/Time   WBC 5.0 10/17/2019 0548   HGB 12.5 (L) 10/17/2019 0548   HGB 9.0 (L) 11/10/2012 0448   HCT 39.2 10/17/2019 0548   HCT 25.2 (L) 11/08/2012 0437   PLT 202 10/17/2019 0548   PLT 131 (L) 11/08/2012 0437   Comprehensive Metabolic Panel:    Component Value Date/Time   NA 149 (H) 10/17/2019 0548   NA 145 (H) 04/17/2017 1620   NA 139 04/21/2013 1440   K  3.6 10/17/2019 0548   K 3.9 04/21/2013 1440   BUN 19 10/17/2019 0548   BUN 14 04/17/2017 1620   BUN 12 04/21/2013 1440   CREATININE 1.65 (H) 10/17/2019 0548   CREATININE 1.55 (H) 12/05/2016 1623   ALBUMIN 2.8 (L) 10/17/2019 0548   ALBUMIN 4.4 04/17/2017 1620   ALBUMIN 3.4 04/21/2013 1440     Review of Systems  Unable to perform ROS: Mental status change  Eyes: Positive for itching.   Prognosis: Guarded-poor  Discharge Planning:  To Be Determined  Recommendations: . Full Code-as confirmed by daughter.  Ernest Lewis requesting to continue ongoing discussions with her brother. . Continue with current plan of care per medical team  . Family remains hopeful for improvement/stability.  Expressed goals are clear to continue with treating the treatable, allow patient every opportunity to show some stability/improvement with understanding may not return to previous baseline and/or may further decline. . Encourage family to continue ongoing discussions. Marland Kitchen PMT will continue to support and follow. Please call team line with urgent needs.   Palliative Performance Scale: PPS 30%              Daughter expressed understanding and was in agreement with this plan.   Thank you for allowing the Palliative Medicine Team to assist in the care of this patient.  Time In: 1300 Time Out: 1355 Time Total:55 min.   Visit consisted of counseling and education dealing with the complex and emotionally intense issues of symptom management and palliative care in the setting of serious and potentially  life-threatening illness.Greater than 50%  of this time was spent counseling and coordinating care related to the above assessment and plan.  Signed by:  Willette Alma, AGPCNP-BC Palliative Medicine Team  Phone: [phone] Pager: [phone] Amion: Thea Alken

## 2019-10-17 NOTE — TOC Progression Note (Signed)
Transition of Care [facility]) - Progression Note    [name] MRN: [id number] Date of Birth: 12/03/1940  [facility] CM/SW Contact  Allayne Butcher, RN Phone Number: 10/17/2019, 4:23 PM  Clinical Narrative:    When medically stable patient will go to [facility] has started insurance auth with [facility].  As long as patient's sodium goes down tonight he will be ready for discharge tomorrow.    Expected Discharge Plan: Skilled Nursing Facility Barriers to Discharge: Continued Medical Work up  Expected Discharge Plan and Services Expected Discharge Plan: Skilled Nursing Facility   Discharge Planning Services: CM Consult Post Acute Care Choice: Skilled Nursing Facility Living arrangements for the past 2 months: Single Family Home                                       Social Determinants of Health (SDOH) Interventions    Readmission Risk Interventions No flowsheet data found.

## 2019-10-18 DIAGNOSIS — R4189 Other symptoms and signs involving cognitive functions and awareness: Secondary | ICD-10-CM | POA: Diagnosis not present

## 2019-10-18 DIAGNOSIS — E43 Unspecified severe protein-calorie malnutrition: Secondary | ICD-10-CM

## 2019-10-18 DIAGNOSIS — N179 Acute kidney failure, unspecified: Secondary | ICD-10-CM | POA: Diagnosis not present

## 2019-10-18 DIAGNOSIS — U071 COVID-19: Secondary | ICD-10-CM | POA: Diagnosis not present

## 2019-10-18 LAB — COMPREHENSIVE METABOLIC PANEL
ALT: 41 U/L (ref 0–44)
AST: 42 U/L — ABNORMAL HIGH (ref 15–41)
Albumin: 2.7 g/dL — ABNORMAL LOW (ref 3.5–5.0)
Alkaline Phosphatase: 43 U/L (ref 38–126)
Anion gap: 7 (ref 5–15)
BUN: 24 mg/dL — ABNORMAL HIGH (ref 8–23)
CO2: 28 mmol/L (ref 22–32)
Calcium: 8.9 mg/dL (ref 8.9–10.3)
Chloride: 112 mmol/L — ABNORMAL HIGH (ref 98–111)
Creatinine, Ser: 1.64 mg/dL — ABNORMAL HIGH (ref 0.61–1.24)
GFR, Estimated: 39 mL/min — ABNORMAL LOW (ref >60)
Glucose, Bld: 120 mg/dL — ABNORMAL HIGH (ref 70–99)
Potassium: 4.1 mmol/L (ref 3.5–5.1)
Sodium: 147 mmol/L — ABNORMAL HIGH (ref 135–145)
Total Bilirubin: 0.7 mg/dL (ref 0.3–1.2)
Total Protein: 6.4 g/dL — ABNORMAL LOW (ref 6.5–8.1)

## 2019-10-18 LAB — CULTURE, BLOOD (ROUTINE X 2)
Culture: NO GROWTH
Culture: NO GROWTH
Special Requests: ADEQUATE

## 2019-10-18 LAB — GLUCOSE, CAPILLARY
Glucose-Capillary: 125 mg/dL — ABNORMAL HIGH (ref 70–99)
Glucose-Capillary: 84 mg/dL (ref 70–99)
Glucose-Capillary: 96 mg/dL (ref 70–99)
Glucose-Capillary: 110 mg/dL — ABNORMAL HIGH (ref 70–99)

## 2019-10-18 MED ORDER — MIRTAZAPINE 15 MG PO TABS: 15.0000 mg | ORAL_TABLET | Freq: Every day | ORAL | Status: AC

## 2019-10-18 MED ORDER — ENSURE ENLIVE PO LIQD: 237.0000 mL | Freq: Three times a day (TID) | ORAL | 12 refills | Status: AC

## 2019-10-18 NOTE — Discharge Summary (Signed)
Physician Discharge Summary  Ernest Lewis GNF:[id number] DOB: 01/30/1940 DOA: 10/12/2019  PCP: Malva Limes, MD  Admit date: 10/12/2019 Discharge date: 10/19/2019  Admitted From: Home Disposition: SNF with palliative care following.  Recommendations for Outpatient Follow-up:  1. Follow up with PCP in 1-2 weeks 2. Follow-up with outpatient palliative care 3. Monitor BMP and CBC in next week.  Home Health: No Equipment/Devices: None  Discharge Condition: Stable CODE STATUS: Full Diet recommendation: Carb-modified Brief/Interim Summary: Ernest Montgomery Morrowis an 79 y.o.malewith medical history significant fortype 2 diabetes, hypertension, CKD stage IIIa, history of GI bleed and GERD who presented with concerns of decreased p.o. intake.  Patient only alert and oriented to self and place and was unable to provide any history about why he is here in the ED. No family at bedside. History obtained entirely through ED documentation ED physician. Apparently patient was diagnosed with Covid about 2 days ago and has had significant decreased p.o. intake with fever. Reportedly patient has also had increased confusion for the past 2 months and family has been awaiting outpatient work-up for this.  Patient incidentally was found to be Covid positive.  No oxygen requirement or pneumonia noted on chest x-ray.  Monoclonal antibody infusion ordered and administered.  Long conversation with patient's daughter via phone.  Patient has had functional decline over the past several months.  Forgetfulness, poor p.o. intake, poor self-care, medication nonadherence.  Patient lives alone and has children living locally but no one that can provide him around-the-clock care.  Therapy and TOC evaluated patient determined him appropriate for skilled nursing facility at time of discharge.  Mental status been waxing and waning. No oxygen requirement patient is medically stable for discharge to skilled  nursing facility.   Discharge Diagnoses:  Principal Problem:   AKI (acute kidney injury) (HCC) Active Problems:   COVID-19 virus infection   Cognitive impairment   Protein-calorie malnutrition, severe  Acute metabolic encephalopathy Unclear etiology, though suspect multifactorial with chronic component (possible dementia) and acute delirium exacerbated by dehydration initially and hypernatremia which has resolved. Has improved with rehydration.  - Plan to discharge to skilled nursing facility.    - Continue delirium precautions - Encourage p.o. intake - Outpatient palliative care referral  Hypernatremia Likely secondary to poor p.o. intake. Resolved.   AKI on stage IIIa CKD: Improved - Avoid nephrotoxic agent  Bacteriuria: s/p ceftriaxone. No growth on blood or urine cultures.   COVID-19 infection: Incidental without respiratory symptoms, hypoxia, or infiltrates on CXR. s/p monoclonal antibody 10/7.  - Continued isolation for 10 days from date of + test which was 10/4.   Cognitive impairment Poor p.o. intake Functional decline Reportedly has been ongoing for several months.  No focal neurological deficits.  Daughter was concerned about patient's ability to care for himself Does not eat food nor take medications regularly Patient is now medically stable for discharge.  Disposition plan for skilled nursing facility.     Discharge Instructions   Allergies as of 10/19/2019   No Known Allergies     Medication List    STOP taking these medications   aspirin EC 81 MG tablet   azithromycin 250 MG tablet Commonly known as: ZITHROMAX     TAKE these medications   amLODipine 5 MG tablet Commonly known as: NORVASC \   feeding supplement (ENSURE ENLIVE) Liqd Take 237 mLs by mouth 3 (three) times daily between meals.   lisinopril 10 MG tablet Commonly known as: ZESTRIL TAKE 1 TABLET(10 MG) BY MOUTH  DAILY   mirtazapine 15 MG tablet Commonly known as:  REMERON Take 1 tablet (15 mg total) by mouth at bedtime.   omeprazole 40 MG capsule Commonly known as: PRILOSEC TAKE 1 CAPSULE(40 MG) BY MOUTH DAILY FOR STOMACH   pravastatin 80 MG tablet Commonly known as: PRAVACHOL TAKE 1 TABLET(80 MG) BY MOUTH DAILY   tamsulosin 0.4 MG Caps capsule Commonly known as: FLOMAX TAKE 1 CAPSULE(0.4 MG) BY MOUTH AT BEDTIME       Contact information for after-discharge care    Destination    [facility] .   Service: Skilled Nursing Contact information: [address] [place] [address] [phone]                 No Known Allergies  Consultations:  Palliative care   Procedures/Studies: DG Abd 1 View  Result Date: 10/13/2019 CLINICAL DATA:  Constipation EXAM: ABDOMEN - 1 VIEW COMPARISON:  None. FINDINGS: Diffusely gas-filled, nondistended small bowel and colon with gas present to the rectum. There is scattered stool throughout the colon. No free air on supine radiographs. IMPRESSION: Diffusely gas-filled, nondistended small bowel and colon with gas present to the rectum. There is scattered stool throughout the colon. Electronically Signed   By: Lauralyn Primes M.D.   On: 10/13/2019 09:16   Subjective: Pt without complaints. Denies dyspnea, pain anywhere, trouble sleeping.   Discharge Exam: Vitals:   10/19/19 0401 10/19/19 0751  BP: 137/71 136/81  Pulse: 67 63  Resp: 17 18  Temp: 98.1 F (36.7 C) 98.5 F (36.9 C)  SpO2: 100% 98%   Vitals:   10/18/19 1538 10/18/19 1919 10/19/19 0401 10/19/19 0751  BP: 133/79 135/83 137/71 136/81  Pulse: 74 71 67 63  Resp: 20 17 17 18   Temp:  97.9 F (36.6 C) 98.1 F (36.7 C) 98.5 F (36.9 C)  TempSrc: Oral Oral Oral   SpO2: 100% 96% 100% 98%  Weight:      Height:       Gen: Elderly male in no distress Pulm: Nonlabored breathing room air. Clear. CV: Regular rate and rhythm. No murmur, rub, or gallop. No JVD, no dependent edema. GI: Abdomen  soft, non-tender, non-distended, with normoactive bowel sounds.   The results of significant diagnostics from this hospitalization (including imaging, microbiology, ancillary and laboratory) are listed below for reference.     Microbiology: Recent Results (from the past 240 hour(s))  Novel Coronavirus, NAA ([facility])     Status: Abnormal   Collection Time: 10/10/19  4:28 PM   Specimen: Nasopharyngeal(NP) swabs in vial transport medium   Nasopharynge  Is this  Result Value Ref Range Status   SARS-CoV-2, NAA Detected (A) Not Detected Final    Comment: Patients who have a positive COVID-19 test result may now have treatment options. Treatment options are available for patients with mild to moderate symptoms and for hospitalized patients. Visit our website at 12/10/19 for resources and information. This nucleic acid amplification test was developed and its performance characteristics determined by [employer]. Nucleic acid amplification tests include RT-PCR and TMA. This test has not been FDA cleared or approved. This test has been authorized by FDA under an Emergency Use Authorization (EUA). This test is only authorized for the duration of time the declaration that circumstances exist justifying the authorization of the emergency use of in vitro diagnostic tests for detection of SARS-CoV-2 virus and/or diagnosis of COVID-19 infection under section 564(b)(1) of the Act, 21 U.S.C. [employer]) (1), unless the  authorization is terminated or revoked sooner. When diagnostic testing is negativ e, the possibility of a false negative result should be considered in the context of a patient's recent exposures and the presence of clinical signs and symptoms consistent with COVID-19. An individual without symptoms of COVID-19 and who is not shedding SARS-CoV-2 virus would expect to have a negative (not detected) result in this assay.   SARS-COV-2, NAA 2 DAY TAT      Status: None   Collection Time: 10/10/19  4:28 PM   Nasopharynge  Is this  Result Value Ref Range Status   SARS-CoV-2, NAA 2 DAY TAT Performed  Final  Urine Culture     Status: None   Collection Time: 10/12/19 12:18 AM   Specimen: Urine, Random  Result Value Ref Range Status   Specimen Description   Final    URINE, RANDOM Performed at [facility], [address] [place], [place] [address]    Special Requests   Final    NONE Performed at [facility], [address] [place], [place] [address]    Culture   Final    NO GROWTH Performed at [facility], [address] [place], [place] [address]    Report Status 10/14/2019 FINAL  Final  CULTURE, BLOOD (ROUTINE X 2) w Reflex to ID Panel     Status: None   Collection Time: 10/13/19  4:31 PM   Specimen: BLOOD  Result Value Ref Range Status   Specimen Description BLOOD RIGHT ANTECUBITAL  Final   Special Requests   Final    BOTTLES DRAWN AEROBIC AND ANAEROBIC Blood Culture adequate volume   Culture   Final    NO GROWTH 5 DAYS Performed at [facility], [address] [place], [place] [address]    Report Status 10/18/2019 FINAL  Final  CULTURE, BLOOD (ROUTINE X 2) w Reflex to ID Panel     Status: None   Collection Time: 10/13/19  4:36 PM   Specimen: BLOOD  Result Value Ref Range Status   Specimen Description BLOOD LEFT ANTECUBITAL  Final   Special Requests   Final    BOTTLES DRAWN AEROBIC AND ANAEROBIC Blood Culture results may not be optimal due to an excessive volume of blood received in culture bottles   Culture   Final    NO GROWTH 5 DAYS Performed at [facility], [address] [place], [place] [address]    Report Status 10/18/2019 FINAL  Final     Labs: BNP (last 3 results) No results for input(s): BNP in the last 8760 hours. Basic Metabolic Panel: Recent Labs  Lab 10/13/19 1638 10/14/19 0530 10/15/19 0557 10/16/19 0352 10/17/19 0548 10/18/19 0548  10/19/19 0330  NA  --    < > 146* 148* 149* 147* 143  K  --    < > 3.9 4.0 3.6 4.1 3.9  CL  --    < > 112* 113* 114* 112* 109  CO2  --    < > 26 27 26 28 28   GLUCOSE  --    < > 85 103* 101* 120* 105*  BUN  --    < > 20 17 19  24* 24*  CREATININE  --    < > 1.49* 1.50* 1.65* 1.64* 1.41*  CALCIUM  --    < > 9.1 9.0 8.6* 8.9 8.8*  MG 2.5*  --   --   --   --   --   --  PHOS 2.7  --   --   --   --   --   --    < > = values in this interval not displayed.   Liver Function Tests: Recent Labs  Lab 10/15/19 0557 10/16/19 0352 10/17/19 0548 10/18/19 0548 10/19/19 0330  AST 57* 49* 50* 42* 44*  ALT 31 32 39 41 45*  ALKPHOS 45 45 42 43 43  BILITOT 0.8 0.8 0.8 0.7 0.6  PROT 7.3 7.4 6.6 6.4* 6.3*  ALBUMIN 3.3* 3.1* 2.8* 2.7* 2.7*   No results for input(s): LIPASE, AMYLASE in the last 168 hours. No results for input(s): AMMONIA in the last 168 hours. CBC: Recent Labs  Lab 10/13/19 0655 10/13/19 0655 10/13/19 1638 10/14/19 0530 10/15/19 0557 10/16/19 0352 10/17/19 0548  WBC 5.9   < > 7.2 6.5 5.2 5.4 5.0  NEUTROABS 4.9  --   --  5.4 3.9 4.1 3.9  HGB 13.1   < > 12.7* 12.1* 13.3 13.4 12.5*  HCT 41.3   < > 40.0 38.3* 40.2 42.1 39.2  MCV 83.1   < > 82.6 84.0 82.7 81.6 82.5  PLT 143*   < > 150 145* 175 195 202   < > = values in this interval not displayed.   Cardiac Enzymes: No results for input(s): CKTOTAL, CKMB, CKMBINDEX, TROPONINI in the last 168 hours. BNP: Invalid input(s): POCBNP CBG: Recent Labs  Lab 10/18/19 0800 10/18/19 1211 10/18/19 1600 10/18/19 1919 10/19/19 0752  GLUCAP 125* 84 96 110* 131*   D-Dimer No results for input(s): DDIMER in the last 72 hours. Hgb A1c No results for input(s): HGBA1C in the last 72 hours. Lipid Profile No results for input(s): CHOL, HDL, LDLCALC, TRIG, CHOLHDL, LDLDIRECT in the last 72 hours. Thyroid function studies No results for input(s): TSH, T4TOTAL, T3FREE, THYROIDAB in the last 72 hours.  Invalid input(s):  FREET3 Anemia work up No results for input(s): VITAMINB12, FOLATE, FERRITIN, TIBC, IRON, RETICCTPCT in the last 72 hours. Urinalysis    Component Value Date/Time   COLORURINE YELLOW (A) 10/12/2019 2215   APPEARANCEUR HAZY (A) 10/12/2019 2215   APPEARANCEUR Clear 11/06/2012 1330   LABSPEC 1.013 10/12/2019 2215   LABSPEC 1.013 11/06/2012 1330   PHURINE 7.0 10/12/2019 2215   GLUCOSEU NEGATIVE 10/12/2019 2215   GLUCOSEU Negative 11/06/2012 1330   HGBUR SMALL (A) 10/12/2019 2215   BILIRUBINUR NEGATIVE 10/12/2019 2215   BILIRUBINUR Negative 11/06/2012 1330   KETONESUR NEGATIVE 10/12/2019 2215   PROTEINUR 30 (A) 10/12/2019 2215   NITRITE NEGATIVE 10/12/2019 2215   LEUKOCYTESUR NEGATIVE 10/12/2019 2215   LEUKOCYTESUR Negative 11/06/2012 1330   Sepsis Labs Invalid input(s): PROCALCITONIN,  WBC,  LACTICIDVEN Microbiology Recent Results (from the past 240 hour(s))  Novel Coronavirus, NAA ([facility])     Status: Abnormal   Collection Time: 10/10/19  4:28 PM   Specimen: Nasopharyngeal(NP) swabs in vial transport medium   Nasopharynge  Is this  Result Value Ref Range Status   SARS-CoV-2, NAA Detected (A) Not Detected Final    Comment: Patients who have a positive COVID-19 test result may now have treatment options. Treatment options are available for patients with mild to moderate symptoms and for hospitalized patients. Visit our website at [email or web address] for resources and information. This nucleic acid amplification test was developed and its performance characteristics determined by [employer]. Nucleic acid amplification tests include RT-PCR and TMA. This test has not been FDA cleared or approved. This test has been authorized by FDA under an Emergency  Use Authorization (EUA). This test is only authorized for the duration of time the declaration that circumstances exist justifying the authorization of the emergency use of in vitro diagnostic tests for  detection of SARS-CoV-2 virus and/or diagnosis of COVID-19 infection under section 564(b)(1) of the Act, 21 U.S.C. 097DZH-2(D) (1), unless the authorization is terminated or revoked sooner. When diagnostic testing is negativ e, the possibility of a false negative result should be considered in the context of a patient's recent exposures and the presence of clinical signs and symptoms consistent with COVID-19. An individual without symptoms of COVID-19 and who is not shedding SARS-CoV-2 virus would expect to have a negative (not detected) result in this assay.   SARS-COV-2, NAA 2 DAY TAT     Status: None   Collection Time: 10/10/19  4:28 PM   Nasopharynge  Is this  Result Value Ref Range Status   SARS-CoV-2, NAA 2 DAY TAT Performed  Final  Urine Culture     Status: None   Collection Time: 10/12/19 12:18 AM   Specimen: Urine, Random  Result Value Ref Range Status   Specimen Description   Final    URINE, RANDOM Performed at [facility], [address], [place], [place] [address]    Special Requests   Final    NONE Performed at [facility], [address] [place], [place] [address]    Culture   Final    NO GROWTH Performed at [facility], [address] [place], [place] [address]    Report Status 10/14/2019 FINAL  Final  CULTURE, BLOOD (ROUTINE X 2) w Reflex to ID Panel     Status: None   Collection Time: 10/13/19  4:31 PM   Specimen: BLOOD  Result Value Ref Range Status   Specimen Description BLOOD RIGHT ANTECUBITAL  Final   Special Requests   Final    BOTTLES DRAWN AEROBIC AND ANAEROBIC Blood Culture adequate volume   Culture   Final    NO GROWTH 5 DAYS Performed at [facility], [address] [place], [place] [address]    Report Status 10/18/2019 FINAL  Final  CULTURE, BLOOD (ROUTINE X 2) w Reflex to ID Panel     Status: None   Collection Time: 10/13/19  4:36 PM   Specimen: BLOOD  Result Value Ref Range Status   Specimen  Description BLOOD LEFT ANTECUBITAL  Final   Special Requests   Final    BOTTLES DRAWN AEROBIC AND ANAEROBIC Blood Culture results may not be optimal due to an excessive volume of blood received in culture bottles   Culture   Final    NO GROWTH 5 DAYS Performed at [facility], [address] [place], [place] [address]    Report Status 10/18/2019 FINAL  Final     Time coordinating discharge:  35 minutes   Tyrone Nine, MD  [facility] 10/19/2019, 11:26 AM

## 2019-10-18 NOTE — Plan of Care (Signed)

## 2019-10-18 NOTE — Progress Notes (Signed)
Occupational Therapy Treatment Patient Details Name: Ernest Lewis MRN: [id number] DOB: Oct 21, 1940 Today's Date: 10/18/2019    History of present illness Pt is an 79 y.o. male presenting to hospital 10/6 with concerns of decreased p.o. intake.  (+) COVID-19.  Per notes pt with functional decline past several months; forgetfulness, poor self care, and medication nonadherence.  Pt admitted with AKI secondary to hypovolemia, ? UTI, COVID-19 infection, and cognitive impairment.  PMH includes alcohol use, carotid artery stenosis, DM, htn, CKD stage 3a, and h/o prostate surgery.   OT comments  Pt seen for OT treatment on this date. Upon arrival to room pt awake, talkative, not demonstrating agitation observed during previous sessions. Pt oriented only to self. Asked for the date, pt responds that it is 1942. Pt's lunch is in room, uneaten. Pt able to come from supine<sit with VCs and encouragement but no physical assist. Therapist asks if pt is hungry, he states yes, but does not seem to understand how to feed himself. With verbal and tactile cues given before eat bite, pt is able to feed himself, declares that the food is good, at one point offers a spoonful of food to the therapist. Pt able to assist in UB dressing, raising arm to thread through sleeves as therapist holds up gown. Pt requires cueing as to which arm goes through which gown sleeve. Pt left sitting on EOB, with PT in room to begin her session. Pt continues to benefit from skilled OT services to maximize participation in functional mobility and ADL tasks. Will continue to follow POC. Discharge recommendation remains appropriate.    Follow Up Recommendations  SNF    Equipment Recommendations       Recommendations for Other Services      Precautions / Restrictions Precautions Precautions: Fall Restrictions Weight Bearing Restrictions: No       Mobility Bed Mobility Overal bed mobility: Needs Assistance Bed Mobility: Supine to  Sit     Supine to sit: Min assist     General bed mobility comments: required frequent VCs/encouragement to come to EOB  Transfers                      Balance Overall balance assessment: Needs assistance Sitting-balance support: Feet unsupported Sitting balance-Leahy Scale: Good Sitting balance - Comments: able to reach outside BOS w/o LOB, with one foot on floor                                   ADL either performed or assessed with clinical judgement   ADL Overall ADL's : Needs assistance/impaired Eating/Feeding: Minimal assistance;Set up Eating/Feeding Details (indicate cue type and reason): Pt able to self-feed, but required VCs/encouragement for each bite             Upper Body Dressing : Minimal assistance Upper Body Dressing Details (indicate cue type and reason): able to thread arms through shirt sleeves, with multiple VCs for initiation/direction                 Functional mobility during ADLs: Moderate assistance       Vision       Perception     Praxis      Cognition Arousal/Alertness: Awake/alert   Overall Cognitive Status: No family/caregiver present to determine baseline cognitive functioning  General Comments: Oriented to self only        Exercises Other Exercises Other Exercises: cognitive redirection, orientation to time/place, educ re: DC planning, bed mobility   Shoulder Instructions       General Comments      Pertinent Vitals/ Pain       Pain Assessment: No/denies pain  Home Living                                          Prior Functioning/Environment              Frequency  Min 1X/week        Progress Toward Goals  OT Goals(current goals can now be found in the care plan section)     Acute Rehab OT Goals Patient Stated Goal: pt unable to state a goal OT Goal Formulation: Patient unable to participate in goal  setting Time For Goal Achievement: 10/27/19 Potential to Achieve Goals: Fair  Plan Discharge plan remains appropriate    Co-evaluation                 AM-PAC OT "6 Clicks" Daily Activity     Outcome Measure   Help from another person eating meals?: A Little Help from another person taking care of personal grooming?: A Little Help from another person toileting, which includes using toliet, bedpan, or urinal?: A Lot Help from another person bathing (including washing, rinsing, drying)?: A Lot Help from another person to put on and taking off regular upper body clothing?: A Little Help from another person to put on and taking off regular lower body clothing?: A Lot 6 Click Score: 15    End of Session    OT Visit Diagnosis: Unsteadiness on feet (R26.81);Other abnormalities of gait and mobility (R26.89);Muscle weakness (generalized) (M62.81);Cognitive communication deficit (R41.841)   Activity Tolerance Patient tolerated treatment well   Patient Left in bed;with call bell/phone within reach;with nursing/sitter in room (PT in room)   Nurse Communication          Time: 7353-2992 OT Time Calculation (min): 14 min  Charges: OT General Charges $OT Visit: 1 Visit OT Treatments $Self Care/Home Management : 8-22 mins  Latina Craver, PhD, MS, OTR/L ascom [phone] 10/18/19, 4:18 PM

## 2019-10-18 NOTE — Progress Notes (Signed)
PROGRESS NOTE    DUVALL COMES  FOY:[id number] DOB: Jul 11, 1940 DOA: 10/12/2019 PCP: Malva Limes, MD   Brief Narrative:  Ernest Lewis is a 79 y.o. male with medical history significant for type 2 diabetes, hypertension, CKD stage IIIa, history of GI bleed and GERD who presents with concerns of decreased p.o. intake.  Patient only alert and oriented to self and place and was unable to provide any history about why he is here in the ED.  No family at bedside.  History obtained entirely through ED documentation ED physician.  Apparently patient was diagnosed with Covid about 2 days ago and has had significant decreased p.o. intake with fever.  Reportedly patient has also had increased confusion for the past 2 months and family has been awaiting outpatient work-up for this.  Patient incidentally was found to be Covid positive.  No oxygen requirement or pneumonia noted on chest x-ray.  Monoclonal antibody infusion ordered and administered.  Long conversation with patient's daughter via phone.  Patient has had functional decline over the past several months.  Forgetfulness, poor p.o. intake, poor self-care, medication nonadherence.  Patient lives alone and has children living locally but no one that can provide him round-the-clock care.  Therapy and TOC evaluated patient determined him appropriate for skilled nursing facility at time of discharge.  Mental status been waxing and waning.  A little lethargic this morning but not agitated.  No oxygen requirement patient is medically stable for discharge to skilled nursing facility.  Insurance authorization pending.   Assessment & Plan:   Principal Problem:   AKI (acute kidney injury) (HCC) Active Problems:   COVID-19 virus infection   Cognitive impairment   Protein-calorie malnutrition, severe  Acute metabolic encephalopathy Unclear etiology.  Patient became very agitated and decreased level consciousness on 10/13/2019.  Since that time  mentation and p.o. intake is slowly started to improve.  Baseline level of mentation is unclear.  Per his daughter patient lives independently and usually takes care of himself however his ability to care for himself has been declining over the past several months.  Plan to discharge to skilled nursing facility Plan: Ophelia Charter has been discontinued As needed IM Haldol Delirium precautions  Hypernatremia Likely secondary to poor p.o. intake Improving over interval Continue D5 water for now while admitted Recheck sodium in a.m. Can likely discontinue at that time   AKI  Suspect secondary to dehydration/volume depletion Has received 1 L of normal saline fluid in the ED.   Creatinine improved over interval At or near baseline kidney function Plan: D5 water Avoid nephrotoxic agent  ? UTI UA shows many bacteria, few WBC and negative nitrite and leukocyte.  He was started on IV Rocephin in the ED.   Urinalysis negative Blood cultures no growth to date Completed empiric course of Rocephin No further antibiotics indicated  COVID-19 infection No signs of any respiratory distress or significant GI symptoms.   patient agreeable to monoclonal antibody Fever T-max 103 on 10/13/2019.  No fever since Plan: Received monoclonal antibody infusion Monitor fever curve and vitals  Cognitive impairment Poor p.o. intake Functional decline Reportedly has been ongoing for several months.   No focal neurological deficits.   Daughter was concerned about patient's ability to care for himself Does not eat food nor take medications regularly Plan: Continue Remeron 15 mg nightly PT and OT consults, recommend SNF Nutrition consult TOC consult Patient is now medically stable for discharge.  Disposition plan for skilled nursing facility.  [employer]  insurance authorization pending   DVT prophylaxis: Lovenox Code Status: Full Family Communication: Daughter Theodis SatoCaroline Woods [phone] on  10/17/2019 Disposition Plan: Status is: Inpatient  Remains inpatient appropriate because:Unsafe d/c plan   Dispo: The patient is from: Home              Anticipated d/c is to: SNF              Anticipated d/c date is: 1 day              Patient currently is medically stable to d/c.    Rather unclear presentation.  Patient with Covid positivity however no evidence of pneumonia.  Had fever of T-max 103 on 07/13/2019.  Mental status is also been waxing and waning.  P.o. intake has been poor.  Elevation in serum sodium level likely due to volume depletion and dehydration.  Placed on D5 water.  Patient has SNF bed available.  [employer] insurance authorization is pending.  Anticipated date of discharge 10/19/2019.  I will complete discharge summary and leave it pended for physician assuming care.  Consultants:   None  Procedures:   None  Antimicrobials:   None   Subjective: Seen and examined.  No specific complaints.  Confused  Objective: Vitals:   10/17/19 2003 10/18/19 0626 10/18/19 0800 10/18/19 1210  BP: 112/63 115/72 138/83 137/65  Pulse: 62 63 65 (!) 59  Resp: 20 20 17 16   Temp:  98.1 F (36.7 C) 98.5 F (36.9 C) 97.9 F (36.6 C)  TempSrc: Oral Oral    SpO2: 100% 99% 100% 100%  Weight:      Height:        Intake/Output Summary (Last 24 hours) at 10/18/2019 1457 Last data filed at 10/18/2019 1029 Gross per 24 hour  Intake 2200.95 ml  Output 1400 ml  Net 800.95 ml   Filed Weights   10/12/19 1634  Weight: 83.9 kg    Examination:  General exam: No acute distress.  Appears frail Respiratory system: Poor respiratory effort.  Decreased at bases.  Normal work of breathing.  Room air Cardiovascular system: S1 & S2 heard, RRR. No JVD, murmurs, rubs, gallops or clicks. No pedal edema. Gastrointestinal system: Abdomen is nondistended, soft and nontender. No organomegaly or masses felt. Normal bowel sounds heard. Central nervous system: Alert to person only.  No focal  deficits  extremities: Diffusely decreased power bilateral upper and lower extremities Skin: No rashes, lesions or ulcers Psychiatry: Judgement and insight appear poor. Mood & affect flattened.     Data Reviewed: I have personally reviewed following labs and imaging studies  CBC: Recent Labs  Lab 10/13/19 0655 10/13/19 0655 10/13/19 1638 10/14/19 0530 10/15/19 0557 10/16/19 0352 10/17/19 0548  WBC 5.9   < > 7.2 6.5 5.2 5.4 5.0  NEUTROABS 4.9  --   --  5.4 3.9 4.1 3.9  HGB 13.1   < > 12.7* 12.1* 13.3 13.4 12.5*  HCT 41.3   < > 40.0 38.3* 40.2 42.1 39.2  MCV 83.1   < > 82.6 84.0 82.7 81.6 82.5  PLT 143*   < > 150 145* 175 195 202   < > = values in this interval not displayed.   Basic Metabolic Panel: Recent Labs  Lab 10/13/19 0655 10/13/19 1638 10/14/19 0530 10/14/19 0530 10/14/19 1500 10/15/19 0557 10/16/19 0352 10/17/19 0548 10/18/19 0548  NA   < >  --  154*   < > 149* 146* 148* 149* 147*  K   < >  --  4.8  --   --  3.9 4.0 3.6 4.1  CL   < >  --  117*  --   --  112* 113* 114* 112*  CO2   < >  --  27  --   --  GLUCOSE   < >  --  91  --   --  85 103* 101* 120*  BUN   < >  --  26*  --   --  24*  CREATININE   < >  --  1.56*  --   --  1.49* 1.50* 1.65* 1.64*  CALCIUM   < >  --  8.8*  --   --  9.1 9.0 8.6* 8.9  MG  --  2.5*  --   --   --   --   --   --   --   PHOS  --  2.7  --   --   --   --   --   --   --    < > = values in this interval not displayed.   GFR: Estimated Creatinine Clearance: 37.3 mL/min (A) (by C-G formula based on SCr of 1.64 mg/dL (H)). Liver Function Tests: Recent Labs  Lab 10/14/19 0530 10/15/19 0557 10/16/19 0352 10/17/19 0548 10/18/19 0548  AST 49* 57* 49* 50* 42*  ALT 27 31 32 39 41  ALKPHOS 38 45 45 42 43  BILITOT 0.8 0.8 0.8 0.8 0.7  PROT 6.9 7.3 7.4 6.6 6.4*  ALBUMIN 3.0* 3.3* 3.1* 2.8* 2.7*   No results for input(s): LIPASE, AMYLASE in the last 168 hours. No results for input(s): AMMONIA in the last 168  hours. Coagulation Profile: No results for input(s): INR, PROTIME in the last 168 hours. Cardiac Enzymes: No results for input(s): CKTOTAL, CKMB, CKMBINDEX, TROPONINI in the last 168 hours. BNP (last 3 results) No results for input(s): PROBNP in the last 8760 hours. HbA1C: No results for input(s): HGBA1C in the last 72 hours. CBG: Recent Labs  Lab 10/17/19 0805 10/17/19 1131 10/17/19 1608 10/17/19 2125 10/18/19 0800  GLUCAP 106* 92 79 90 125*   Lipid Profile: No results for input(s): CHOL, HDL, LDLCALC, TRIG, CHOLHDL, LDLDIRECT in the last 72 hours. Thyroid Function Tests: No results for input(s): TSH, T4TOTAL, FREET4, T3FREE, THYROIDAB in the last 72 hours. Anemia Panel: No results for input(s): VITAMINB12, FOLATE, FERRITIN, TIBC, IRON, RETICCTPCT in the last 72 hours. Sepsis Labs: Recent Labs  Lab 10/13/19 1639 10/13/19 1923  LATICACIDVEN 3.0* 2.0*    Recent Results (from the past 240 hour(s))  Novel Coronavirus, NAA ([facility])     Status: Abnormal   Collection Time: 10/10/19  4:28 PM   Specimen: Nasopharyngeal(NP) swabs in vial transport medium   Nasopharynge  Is this  Result Value Ref Range Status   SARS-CoV-2, NAA Detected (A) Not Detected Final    Comment: Patients who have a positive COVID-19 test result may now have treatment options. Treatment options are available for patients with mild to moderate symptoms and for hospitalized patients. Visit our website at [email or web address] for resources and information. This nucleic acid amplification test was developed and its performance characteristics determined by [employer]. Nucleic acid amplification tests include RT-PCR and TMA. This test has not been FDA cleared or approved. This test has been authorized by FDA under an Emergency Use Authorization (EUA). This test is only authorized for the duration of time the declaration that circumstances exist  justifying the authorization of the  emergency use of in vitro diagnostic tests for detection of SARS-CoV-2 virus and/or diagnosis of COVID-19 infection under section 564(b)(1) of the Act, 21 U.S.C. 553ZSM-2(L) (1), unless the authorization is terminated or revoked sooner. When diagnostic testing is negativ e, the possibility of a false negative result should be considered in the context of a patient's recent exposures and the presence of clinical signs and symptoms consistent with COVID-19. An individual without symptoms of COVID-19 and who is not shedding SARS-CoV-2 virus would expect to have a negative (not detected) result in this assay.   SARS-COV-2, NAA 2 DAY TAT     Status: None   Collection Time: 10/10/19  4:28 PM   Nasopharynge  Is this  Result Value Ref Range Status   SARS-CoV-2, NAA 2 DAY TAT Performed  Final  Urine Culture     Status: None   Collection Time: 10/12/19 12:18 AM   Specimen: Urine, Random  Result Value Ref Range Status   Specimen Description   Final    URINE, RANDOM Performed at [facility], [address] [place], [place] [address]    Special Requests   Final    NONE Performed at [facility], [address], [place], [place] [address]    Culture   Final    NO GROWTH Performed at [facility], [address] [place], [place] [address]    Report Status 10/14/2019 FINAL  Final  CULTURE, BLOOD (ROUTINE X 2) w Reflex to ID Panel     Status: None   Collection Time: 10/13/19  4:31 PM   Specimen: BLOOD  Result Value Ref Range Status   Specimen Description BLOOD RIGHT ANTECUBITAL  Final   Special Requests   Final    BOTTLES DRAWN AEROBIC AND ANAEROBIC Blood Culture adequate volume   Culture   Final    NO GROWTH 5 DAYS Performed at [facility], [address] [place], [place] [address]    Report Status 10/18/2019 FINAL  Final  CULTURE, BLOOD (ROUTINE X 2) w Reflex to ID Panel     Status: None   Collection Time: 10/13/19  4:36 PM   Specimen: BLOOD   Result Value Ref Range Status   Specimen Description BLOOD LEFT ANTECUBITAL  Final   Special Requests   Final    BOTTLES DRAWN AEROBIC AND ANAEROBIC Blood Culture results may not be optimal due to an excessive volume of blood received in culture bottles   Culture   Final    NO GROWTH 5 DAYS Performed at [facility], [address], [place], [place] [address]    Report Status 10/18/2019 FINAL  Final         Radiology Studies: No results found.      Scheduled Meds: . amLODipine  10 mg Oral Daily  . enoxaparin (LOVENOX) injection  40 mg Subcutaneous Q24H  . feeding supplement (ENSURE ENLIVE)  237 mL Oral TID BM  . folic acid  1 mg Oral Daily  . insulin aspart  0-15 Units Subcutaneous TID WC  . insulin aspart  0-5 Units Subcutaneous QHS  . mirtazapine  15 mg Oral QHS  . multivitamin with minerals  1 tablet Oral Daily  . simethicone  80 mg Oral QID   Continuous Infusions: . sodium chloride    . dextrose 75 mL/hr at 10/18/19 1032  . famotidine (PEPCID) IV       LOS: 5 days    Time  spent: 15 minutes    Tresa Moore, MD [facility] Pager [phone]  If 7PM-7AM, please contact night-coverage 10/18/2019, 2:57 PM

## 2019-10-18 NOTE — Progress Notes (Signed)
Daughter given daily update

## 2019-10-18 NOTE — Progress Notes (Signed)
Physical Therapy Treatment Patient Details Name: Ernest Lewis MRN: [id number] DOB: 09-07-1940 Today's Date: 10/18/2019    History of Present Illness Pt is an 79 y.o. male presenting to hospital 10/6 with concerns of decreased p.o. intake.  (+) COVID-19.  Per notes pt with functional decline past several months; forgetfulness, poor self care, and medication nonadherence.  Pt admitted with AKI secondary to hypovolemia, ? UTI, COVID-19 infection, and cognitive impairment.  PMH includes alcohol use, carotid artery stenosis, DM, htn, CKD stage 3a, and h/o prostate surgery.    PT Comments    Pt presents sitting EOB and agreeable to PT treatment. Pt performed some sitting exercises before ambulating with RW. Pt requires CGA and VCs for sit<>stand transfer and CGA/MinA+1 throughout ambulation for proper walker management. Pt appears shaky while walking and needing assist with walker navigation. At first, patient walks with RLE in external rotation, but corrects it slighty after VCs. Once in the room, pt perform seated exercises in chair. Pt left in chair with all needs. He is confused throughout session and only oriented to himself. Pt will benefit from skilled PT services to address deficits in strength, balance, and decrease risk for future falls.    Follow Up Recommendations  SNF     Equipment Recommendations  Rolling walker with 5" wheels;3in1 (PT)    Recommendations for Other Services       Precautions / Restrictions Precautions Precautions: Fall Restrictions Weight Bearing Restrictions: No    Mobility  Bed Mobility Overal bed mobility: Needs Assistance Bed Mobility: Supine to Sit     Supine to sit: Min assist     General bed mobility comments: Did not assess as pt was sitting EOB on arrival to room  Transfers Overall transfer level: Needs assistance Equipment used: Rolling walker (2 wheeled) Transfers: Sit to/from Stand Sit to Stand: Min guard         General  transfer comment: Pt able to stand up with minA+1. VCs utilized to push from the bed  Ambulation/Gait Ambulation/Gait assistance: Min guard;Min Chemical engineer (Feet): 100 Feet Assistive device: Rolling walker (2 wheeled) Gait Pattern/deviations: Step-through pattern;Decreased step length - right;Decreased step length - left     General Gait Details: Pt appears shaky while walking and needing assist with walker navigation. At first, patient walks with RLE in external rotation, but corrects it slighty after VCs.   Stairs             Wheelchair Mobility    Modified Rankin (Stroke Patients Only)       Balance Overall balance assessment: Needs assistance Sitting-balance support: Feet supported Sitting balance-Leahy Scale: Good Sitting balance - Comments: Pt able to maintain balance with one foot on floor   Standing balance support: Bilateral upper extremity supported Standing balance-Leahy Scale: Good Standing balance comment: Pt able to maintain balance with BUE support on walker                            Cognition Arousal/Alertness: Awake/alert Behavior During Therapy: Flat affect Overall Cognitive Status: No family/caregiver present to determine baseline cognitive functioning                                 General Comments: Oriented to self only      Exercises General Exercises - Lower Extremity Long Arc Quad: AROM;Both;10 reps;Seated Straight Leg Raises: AROM;Both;10 reps;Seated Hip Flexion/Marching: AROM;Both;10  reps;Standing;Seated (Pt performs both standing and seated) Other Exercises Other Exercises: cognitive redirection, orientation to time/place, educ re: DC planning, bed mobility Other Exercises: Pt performed 10 breaths on flutter valve and IS. He reached 250 mL with IS.     General Comments        Pertinent Vitals/Pain Pain Assessment: No/denies pain    Home Living                      Prior Function             PT Goals (current goals can now be found in the care plan section) Acute Rehab PT Goals Patient Stated Goal: pt unable to state a goal PT Goal Formulation: With patient Time For Goal Achievement: 10/27/19 Potential to Achieve Goals: Good Progress towards PT goals: Progressing toward goals    Frequency    Min 2X/week      PT Plan Current plan remains appropriate    Co-evaluation              AM-PAC PT "6 Clicks" Mobility   Outcome Measure  Help needed turning from your back to your side while in a flat bed without using bedrails?: None Help needed moving from lying on your back to sitting on the side of a flat bed without using bedrails?: A Little Help needed moving to and from a bed to a chair (including a wheelchair)?: A Little Help needed standing up from a chair using your arms (e.g., wheelchair or bedside chair)?: A Little Help needed to walk in hospital room?: A Little Help needed climbing 3-5 steps with a railing? : A Little 6 Click Score: 19    End of Session Equipment Utilized During Treatment: Gait belt Activity Tolerance: Patient tolerated treatment well Patient left: in chair;with call bell/phone within reach;with chair alarm set Nurse Communication: Mobility status PT Visit Diagnosis: Unsteadiness on feet (R26.81);Other abnormalities of gait and mobility (R26.89);Muscle weakness (generalized) (M62.81)     Time: 0973-5329 PT Time Calculation (min) (ACUTE ONLY): 31 min  Charges:                         Katherine Basset, SPT Baker Pierini 10/18/2019, 5:09 PM

## 2019-10-19 ENCOUNTER — Telehealth: Payer: Self-pay

## 2019-10-19 DIAGNOSIS — I10 Essential (primary) hypertension: Secondary | ICD-10-CM | POA: Diagnosis not present

## 2019-10-19 DIAGNOSIS — Z8616 Personal history of COVID-19: Secondary | ICD-10-CM | POA: Diagnosis not present

## 2019-10-19 DIAGNOSIS — R262 Difficulty in walking, not elsewhere classified: Secondary | ICD-10-CM | POA: Diagnosis not present

## 2019-10-19 DIAGNOSIS — R7981 Abnormal blood-gas level: Secondary | ICD-10-CM | POA: Diagnosis not present

## 2019-10-19 DIAGNOSIS — R278 Other lack of coordination: Secondary | ICD-10-CM | POA: Diagnosis not present

## 2019-10-19 DIAGNOSIS — R2681 Unsteadiness on feet: Secondary | ICD-10-CM | POA: Diagnosis not present

## 2019-10-19 DIAGNOSIS — F0390 Unspecified dementia without behavioral disturbance: Secondary | ICD-10-CM | POA: Diagnosis not present

## 2019-10-19 DIAGNOSIS — I152 Hypertension secondary to endocrine disorders: Secondary | ICD-10-CM | POA: Diagnosis not present

## 2019-10-19 DIAGNOSIS — Z7401 Bed confinement status: Secondary | ICD-10-CM | POA: Diagnosis not present

## 2019-10-19 DIAGNOSIS — K219 Gastro-esophageal reflux disease without esophagitis: Secondary | ICD-10-CM | POA: Diagnosis not present

## 2019-10-19 DIAGNOSIS — R41841 Cognitive communication deficit: Secondary | ICD-10-CM | POA: Diagnosis not present

## 2019-10-19 DIAGNOSIS — E860 Dehydration: Secondary | ICD-10-CM | POA: Diagnosis not present

## 2019-10-19 DIAGNOSIS — M2550 Pain in unspecified joint: Secondary | ICD-10-CM | POA: Diagnosis not present

## 2019-10-19 DIAGNOSIS — R404 Transient alteration of awareness: Secondary | ICD-10-CM | POA: Diagnosis not present

## 2019-10-19 DIAGNOSIS — N1831 Chronic kidney disease, stage 3a: Secondary | ICD-10-CM | POA: Diagnosis not present

## 2019-10-19 DIAGNOSIS — E782 Mixed hyperlipidemia: Secondary | ICD-10-CM | POA: Diagnosis not present

## 2019-10-19 DIAGNOSIS — G9341 Metabolic encephalopathy: Secondary | ICD-10-CM | POA: Diagnosis not present

## 2019-10-19 DIAGNOSIS — E1122 Type 2 diabetes mellitus with diabetic chronic kidney disease: Secondary | ICD-10-CM | POA: Diagnosis not present

## 2019-10-19 DIAGNOSIS — M6281 Muscle weakness (generalized): Secondary | ICD-10-CM | POA: Diagnosis not present

## 2019-10-19 DIAGNOSIS — R4182 Altered mental status, unspecified: Secondary | ICD-10-CM | POA: Diagnosis not present

## 2019-10-19 DIAGNOSIS — N401 Enlarged prostate with lower urinary tract symptoms: Secondary | ICD-10-CM | POA: Diagnosis not present

## 2019-10-19 DIAGNOSIS — E43 Unspecified severe protein-calorie malnutrition: Secondary | ICD-10-CM | POA: Diagnosis not present

## 2019-10-19 DIAGNOSIS — U071 COVID-19: Secondary | ICD-10-CM | POA: Diagnosis not present

## 2019-10-19 DIAGNOSIS — E1159 Type 2 diabetes mellitus with other circulatory complications: Secondary | ICD-10-CM | POA: Diagnosis not present

## 2019-10-19 DIAGNOSIS — R1312 Dysphagia, oropharyngeal phase: Secondary | ICD-10-CM | POA: Diagnosis not present

## 2019-10-19 DIAGNOSIS — R5381 Other malaise: Secondary | ICD-10-CM | POA: Diagnosis not present

## 2019-10-19 LAB — GLUCOSE, CAPILLARY
Glucose-Capillary: 131 mg/dL — ABNORMAL HIGH (ref 70–99)
Glucose-Capillary: 114 mg/dL — ABNORMAL HIGH (ref 70–99)

## 2019-10-19 LAB — COMPREHENSIVE METABOLIC PANEL
ALT: 45 U/L — ABNORMAL HIGH (ref 0–44)
AST: 44 U/L — ABNORMAL HIGH (ref 15–41)
Albumin: 2.7 g/dL — ABNORMAL LOW (ref 3.5–5.0)
Alkaline Phosphatase: 43 U/L (ref 38–126)
Anion gap: 6 (ref 5–15)
BUN: 24 mg/dL — ABNORMAL HIGH (ref 8–23)
CO2: 28 mmol/L (ref 22–32)
Calcium: 8.8 mg/dL — ABNORMAL LOW (ref 8.9–10.3)
Chloride: 109 mmol/L (ref 98–111)
Creatinine, Ser: 1.41 mg/dL — ABNORMAL HIGH (ref 0.61–1.24)
GFR, Estimated: 46 mL/min — ABNORMAL LOW (ref >60)
Glucose, Bld: 105 mg/dL — ABNORMAL HIGH (ref 70–99)
Potassium: 3.9 mmol/L (ref 3.5–5.1)
Sodium: 143 mmol/L (ref 135–145)
Total Bilirubin: 0.6 mg/dL (ref 0.3–1.2)
Total Protein: 6.3 g/dL — ABNORMAL LOW (ref 6.5–8.1)

## 2019-10-19 NOTE — Progress Notes (Signed)
Report given to EMS [place].

## 2019-10-19 NOTE — Telephone Encounter (Signed)
That's fine he can have letter saying so. He was hospitalized 10/6 through 10/19/2019. Thanks

## 2019-10-19 NOTE — TOC Transition Note (Signed)
Transition of Care [facility]) - CM/SW Discharge Note   [name] MRN: [id number] Date of Birth: 06-Nov-1940  Transition of Care [facility]) CM/SW Contact:  Allayne Butcher, RN Phone Number: 10/19/2019, 1:40 PM   Clinical Narrative:    Patient is medically cleared for discharge to [facility] and [facility] approved authorization for SNF.  Patient will be going to room 707P, bedside RN will call report to [phone].  EMS has been arranged and patient is second on the list for pick up.  Patient's daughter has been updated on discharge plan.    Final next level of care: Skilled Nursing Facility Barriers to Discharge: Barriers Resolved   Patient Goals and CMS Choice Patient states their goals for this hospitalization and ongoing recovery are:: Daughters concers surround his not eating and drinking. CMS Medicare.gov Compare Post Acute Care list provided to:: Patient Represenative (must comment) Choice offered to / list presented to : Adult Children  Discharge Placement              Patient chooses bed at: [facility] Patient to be transferred to facility by: [facility] Name of family member notified: [name]- daughter Patient and family notified of of transfer: 10/19/19  Discharge Plan and Services   Discharge Planning Services: CM Consult Post Acute Care Choice: Skilled Nursing Facility            DME Agency: NA       HH Arranged: NA          Social Determinants of Health (SDOH) Interventions     Readmission Risk Interventions No flowsheet data found.

## 2019-10-19 NOTE — Telephone Encounter (Signed)
Copied from CRM [id number]. Topic: General - Other >> Oct 19, 2019 10:50 AM Leafy Ro wrote: Reason for CRM: Pt daughter carolyn woods who is listed as EC . Pt daughter needs on letter head that her dad tested positive for covid and that he is in hospital to give to his  supervisor

## 2019-10-19 NOTE — Progress Notes (Signed)
Attempted to call report to [facility], no response. Will attempt again.

## 2019-10-20 DIAGNOSIS — K219 Gastro-esophageal reflux disease without esophagitis: Secondary | ICD-10-CM | POA: Diagnosis not present

## 2019-10-20 DIAGNOSIS — E782 Mixed hyperlipidemia: Secondary | ICD-10-CM | POA: Diagnosis not present

## 2019-10-20 DIAGNOSIS — G9341 Metabolic encephalopathy: Secondary | ICD-10-CM | POA: Diagnosis not present

## 2019-10-20 DIAGNOSIS — E43 Unspecified severe protein-calorie malnutrition: Secondary | ICD-10-CM | POA: Diagnosis not present

## 2019-10-20 DIAGNOSIS — R262 Difficulty in walking, not elsewhere classified: Secondary | ICD-10-CM | POA: Diagnosis not present

## 2019-10-20 DIAGNOSIS — E1159 Type 2 diabetes mellitus with other circulatory complications: Secondary | ICD-10-CM | POA: Diagnosis not present

## 2019-10-20 DIAGNOSIS — R5381 Other malaise: Secondary | ICD-10-CM | POA: Diagnosis not present

## 2019-10-20 DIAGNOSIS — I10 Essential (primary) hypertension: Secondary | ICD-10-CM | POA: Diagnosis not present

## 2019-10-20 DIAGNOSIS — I152 Hypertension secondary to endocrine disorders: Secondary | ICD-10-CM | POA: Diagnosis not present

## 2019-10-20 DIAGNOSIS — N401 Enlarged prostate with lower urinary tract symptoms: Secondary | ICD-10-CM | POA: Diagnosis not present

## 2019-10-20 DIAGNOSIS — R2681 Unsteadiness on feet: Secondary | ICD-10-CM | POA: Diagnosis not present

## 2019-10-20 DIAGNOSIS — F0390 Unspecified dementia without behavioral disturbance: Secondary | ICD-10-CM | POA: Diagnosis not present

## 2019-10-21 NOTE — Telephone Encounter (Signed)
Letter printed. patient's daughter advised.

## 2019-10-24 DIAGNOSIS — N401 Enlarged prostate with lower urinary tract symptoms: Secondary | ICD-10-CM | POA: Diagnosis not present

## 2019-10-24 DIAGNOSIS — I10 Essential (primary) hypertension: Secondary | ICD-10-CM | POA: Diagnosis not present

## 2019-10-24 DIAGNOSIS — R262 Difficulty in walking, not elsewhere classified: Secondary | ICD-10-CM | POA: Diagnosis not present

## 2019-10-24 DIAGNOSIS — G9341 Metabolic encephalopathy: Secondary | ICD-10-CM | POA: Diagnosis not present

## 2019-10-24 DIAGNOSIS — E782 Mixed hyperlipidemia: Secondary | ICD-10-CM | POA: Diagnosis not present

## 2019-10-24 DIAGNOSIS — F0390 Unspecified dementia without behavioral disturbance: Secondary | ICD-10-CM | POA: Diagnosis not present

## 2019-10-24 DIAGNOSIS — R2681 Unsteadiness on feet: Secondary | ICD-10-CM | POA: Diagnosis not present

## 2019-10-24 DIAGNOSIS — K219 Gastro-esophageal reflux disease without esophagitis: Secondary | ICD-10-CM | POA: Diagnosis not present

## 2019-10-26 DIAGNOSIS — R7981 Abnormal blood-gas level: Secondary | ICD-10-CM | POA: Diagnosis not present

## 2019-10-27 ENCOUNTER — Telehealth: Payer: Self-pay | Admitting: Family Medicine

## 2019-10-27 DIAGNOSIS — Z8616 Personal history of COVID-19: Secondary | ICD-10-CM | POA: Diagnosis not present

## 2019-10-27 DIAGNOSIS — E782 Mixed hyperlipidemia: Secondary | ICD-10-CM | POA: Diagnosis not present

## 2019-10-27 DIAGNOSIS — K219 Gastro-esophageal reflux disease without esophagitis: Secondary | ICD-10-CM | POA: Diagnosis not present

## 2019-10-27 DIAGNOSIS — F0390 Unspecified dementia without behavioral disturbance: Secondary | ICD-10-CM | POA: Diagnosis not present

## 2019-10-27 DIAGNOSIS — R2681 Unsteadiness on feet: Secondary | ICD-10-CM | POA: Diagnosis not present

## 2019-10-27 DIAGNOSIS — G9341 Metabolic encephalopathy: Secondary | ICD-10-CM | POA: Diagnosis not present

## 2019-10-27 DIAGNOSIS — R262 Difficulty in walking, not elsewhere classified: Secondary | ICD-10-CM | POA: Diagnosis not present

## 2019-10-27 DIAGNOSIS — I10 Essential (primary) hypertension: Secondary | ICD-10-CM | POA: Diagnosis not present

## 2019-10-27 DIAGNOSIS — N401 Enlarged prostate with lower urinary tract symptoms: Secondary | ICD-10-CM | POA: Diagnosis not present

## 2019-10-27 NOTE — Telephone Encounter (Signed)
Copied from CRM [id number]. Topic: Medicare AWV >> Oct 27, 2019 10:31 AM Claudette Laws R wrote: Reason for CRM:  No answer unable to leave message for patient to call back and schedule Medicare Annual Wellness Visit (AWV) either virtually or in office.  Last AWV 10/22/2017  Please schedule at anytime with [facility] Health Advisor.  If any questions, please contact me at [phone]

## 2019-10-28 ENCOUNTER — Telehealth: Payer: Self-pay

## 2019-10-28 DIAGNOSIS — E43 Unspecified severe protein-calorie malnutrition: Secondary | ICD-10-CM | POA: Diagnosis not present

## 2019-10-28 DIAGNOSIS — R5381 Other malaise: Secondary | ICD-10-CM | POA: Diagnosis not present

## 2019-10-28 DIAGNOSIS — N1831 Chronic kidney disease, stage 3a: Secondary | ICD-10-CM | POA: Diagnosis not present

## 2019-10-28 DIAGNOSIS — E1159 Type 2 diabetes mellitus with other circulatory complications: Secondary | ICD-10-CM | POA: Diagnosis not present

## 2019-10-28 NOTE — Telephone Encounter (Signed)
Copied from CRM [id number]. Topic: General - Other >> Oct 28, 2019  1:55 PM Tamela Oddi wrote: Reason for CRM: Patient's daughter called to ask if she could get paperwork from the doctor stating that she is filing for guardianship of the patient.  She was told by the nursing facility that she would need this from the patient's PCP.  Please call daughter to discus and see how she could get this process started.  CB# [phone]

## 2019-10-28 NOTE — Telephone Encounter (Signed)
Spoke to daughter and advised from experience that she needs to first establish a DSS Caseworker who will be of much help in [place]. She needs to have a letter stating that patient is incompetent. I explained this is done through the courts even if patient has dementia he does get to have a court date and DSS will help with this and provide him with attorney as well even if he is agreeing with this plan, this is all done in courts. She asked what she needs to take to DSS or courts to prove he has dementia and can we write statement and provide proof for her to give DSS,courts, medicaid, and SNF stating he is indeed unable to make decisions and is incompetent. My experience was they deemed patients incompetent and granted guardianship all in one setting in court. You have to apply to be guardian even if next of kin and it involved credit checks, criminal background, and many other screenings. That is how it worked for my mother recently. I tolfd her a nurse will call her with information but that DSS should handle all of what is needed for this and may requets hospital records and that would go through Medical Record Requests. Do you feel you need to make a letter or do you want to wait on DSS as I stated? Please send this direct to [employer] and I will be happy to call her as we discussed it in length. Send staff message and I iwll contact even if I am at my office. Thank you!

## 2019-10-31 DIAGNOSIS — R262 Difficulty in walking, not elsewhere classified: Secondary | ICD-10-CM | POA: Diagnosis not present

## 2019-10-31 DIAGNOSIS — F0390 Unspecified dementia without behavioral disturbance: Secondary | ICD-10-CM | POA: Diagnosis not present

## 2019-10-31 DIAGNOSIS — E782 Mixed hyperlipidemia: Secondary | ICD-10-CM | POA: Diagnosis not present

## 2019-10-31 DIAGNOSIS — N401 Enlarged prostate with lower urinary tract symptoms: Secondary | ICD-10-CM | POA: Diagnosis not present

## 2019-10-31 DIAGNOSIS — R2681 Unsteadiness on feet: Secondary | ICD-10-CM | POA: Diagnosis not present

## 2019-10-31 DIAGNOSIS — K219 Gastro-esophageal reflux disease without esophagitis: Secondary | ICD-10-CM | POA: Diagnosis not present

## 2019-10-31 DIAGNOSIS — G9341 Metabolic encephalopathy: Secondary | ICD-10-CM | POA: Diagnosis not present

## 2019-10-31 DIAGNOSIS — Z8616 Personal history of COVID-19: Secondary | ICD-10-CM | POA: Diagnosis not present

## 2019-10-31 DIAGNOSIS — I10 Essential (primary) hypertension: Secondary | ICD-10-CM | POA: Diagnosis not present

## 2019-11-01 DIAGNOSIS — N1831 Chronic kidney disease, stage 3a: Secondary | ICD-10-CM | POA: Diagnosis not present

## 2019-11-01 DIAGNOSIS — I152 Hypertension secondary to endocrine disorders: Secondary | ICD-10-CM | POA: Diagnosis not present

## 2019-11-01 DIAGNOSIS — E1159 Type 2 diabetes mellitus with other circulatory complications: Secondary | ICD-10-CM | POA: Diagnosis not present

## 2019-11-01 DIAGNOSIS — E860 Dehydration: Secondary | ICD-10-CM | POA: Diagnosis not present

## 2019-11-03 DIAGNOSIS — R2681 Unsteadiness on feet: Secondary | ICD-10-CM | POA: Diagnosis not present

## 2019-11-03 DIAGNOSIS — K219 Gastro-esophageal reflux disease without esophagitis: Secondary | ICD-10-CM | POA: Diagnosis not present

## 2019-11-03 DIAGNOSIS — R262 Difficulty in walking, not elsewhere classified: Secondary | ICD-10-CM | POA: Diagnosis not present

## 2019-11-03 DIAGNOSIS — F0390 Unspecified dementia without behavioral disturbance: Secondary | ICD-10-CM | POA: Diagnosis not present

## 2019-11-03 DIAGNOSIS — N401 Enlarged prostate with lower urinary tract symptoms: Secondary | ICD-10-CM | POA: Diagnosis not present

## 2019-11-03 DIAGNOSIS — Z8616 Personal history of COVID-19: Secondary | ICD-10-CM | POA: Diagnosis not present

## 2019-11-03 DIAGNOSIS — I10 Essential (primary) hypertension: Secondary | ICD-10-CM | POA: Diagnosis not present

## 2019-11-03 DIAGNOSIS — G9341 Metabolic encephalopathy: Secondary | ICD-10-CM | POA: Diagnosis not present

## 2019-11-03 DIAGNOSIS — E782 Mixed hyperlipidemia: Secondary | ICD-10-CM | POA: Diagnosis not present

## 2019-11-08 DIAGNOSIS — N401 Enlarged prostate with lower urinary tract symptoms: Secondary | ICD-10-CM | POA: Diagnosis not present

## 2019-11-08 DIAGNOSIS — I10 Essential (primary) hypertension: Secondary | ICD-10-CM | POA: Diagnosis not present

## 2019-11-08 DIAGNOSIS — K219 Gastro-esophageal reflux disease without esophagitis: Secondary | ICD-10-CM | POA: Diagnosis not present

## 2019-11-08 DIAGNOSIS — R262 Difficulty in walking, not elsewhere classified: Secondary | ICD-10-CM | POA: Diagnosis not present

## 2019-11-08 DIAGNOSIS — F0390 Unspecified dementia without behavioral disturbance: Secondary | ICD-10-CM | POA: Diagnosis not present

## 2019-11-08 DIAGNOSIS — E782 Mixed hyperlipidemia: Secondary | ICD-10-CM | POA: Diagnosis not present

## 2019-11-08 DIAGNOSIS — G9341 Metabolic encephalopathy: Secondary | ICD-10-CM | POA: Diagnosis not present

## 2019-11-08 DIAGNOSIS — R2681 Unsteadiness on feet: Secondary | ICD-10-CM | POA: Diagnosis not present

## 2019-11-08 DIAGNOSIS — Z8616 Personal history of COVID-19: Secondary | ICD-10-CM | POA: Diagnosis not present

## 2019-11-10 DIAGNOSIS — E782 Mixed hyperlipidemia: Secondary | ICD-10-CM | POA: Diagnosis not present

## 2019-11-10 DIAGNOSIS — G9341 Metabolic encephalopathy: Secondary | ICD-10-CM | POA: Diagnosis not present

## 2019-11-10 DIAGNOSIS — Z8616 Personal history of COVID-19: Secondary | ICD-10-CM | POA: Diagnosis not present

## 2019-11-10 DIAGNOSIS — I10 Essential (primary) hypertension: Secondary | ICD-10-CM | POA: Diagnosis not present

## 2019-11-10 DIAGNOSIS — R262 Difficulty in walking, not elsewhere classified: Secondary | ICD-10-CM | POA: Diagnosis not present

## 2019-11-10 DIAGNOSIS — F0390 Unspecified dementia without behavioral disturbance: Secondary | ICD-10-CM | POA: Diagnosis not present

## 2019-11-10 DIAGNOSIS — N401 Enlarged prostate with lower urinary tract symptoms: Secondary | ICD-10-CM | POA: Diagnosis not present

## 2019-11-10 DIAGNOSIS — R2681 Unsteadiness on feet: Secondary | ICD-10-CM | POA: Diagnosis not present

## 2019-11-10 DIAGNOSIS — K219 Gastro-esophageal reflux disease without esophagitis: Secondary | ICD-10-CM | POA: Diagnosis not present

## 2019-11-14 ENCOUNTER — Telehealth: Payer: Self-pay | Admitting: Family Medicine

## 2019-11-14 NOTE — Telephone Encounter (Signed)
Copied from CRM [id number]. Topic: Medicare AWV >> Nov 14, 2019  2:31 PM Claudette Laws R wrote: Reason for CRM:  No answer unable to leave message for patient to call back and schedule Medicare Annual Wellness Visit (AWV) either virtually or in office.  Last AWV 10/22/2017  Please schedule at anytime with [facility] Health Advisor.  If any questions, please contact me at [phone]

## 2019-11-15 ENCOUNTER — Other Ambulatory Visit: Payer: Self-pay

## 2019-11-15 NOTE — Patient Outreach (Signed)
[facility]) Care Management  11/15/2019  Ernest Lewis 10/05/1940 [id number]     Transition of Care Referral  Referral Date: 11/15/2019 Referral Source: [facility] Discharge Report Date of Discharge: 11/11/2019 Facility: [facility] Insurance: [facility]    Referral received. Transition of care calls being completed via EMMI-automated calls. RN CM will outreach patient for any red flags received.     Plan: RN CM will close case at this time.    Antionette Fairy, RN,BSN,CCM [facility] Care Management Telephonic Care Management Coordinator Direct Phone: [phone] Toll Free: [phone] Fax: [phone]

## 2019-11-28 DIAGNOSIS — R4189 Other symptoms and signs involving cognitive functions and awareness: Secondary | ICD-10-CM | POA: Diagnosis not present

## 2019-11-28 DIAGNOSIS — E43 Unspecified severe protein-calorie malnutrition: Secondary | ICD-10-CM | POA: Diagnosis not present

## 2019-11-30 DIAGNOSIS — E1159 Type 2 diabetes mellitus with other circulatory complications: Secondary | ICD-10-CM | POA: Diagnosis not present

## 2019-11-30 DIAGNOSIS — F0150 Vascular dementia without behavioral disturbance: Secondary | ICD-10-CM | POA: Diagnosis not present

## 2019-11-30 DIAGNOSIS — N1831 Chronic kidney disease, stage 3a: Secondary | ICD-10-CM | POA: Diagnosis not present

## 2019-11-30 DIAGNOSIS — I152 Hypertension secondary to endocrine disorders: Secondary | ICD-10-CM | POA: Diagnosis not present

## 2019-11-30 DIAGNOSIS — R9431 Abnormal electrocardiogram [ECG] [EKG]: Secondary | ICD-10-CM | POA: Diagnosis not present

## 2019-12-02 DIAGNOSIS — E860 Dehydration: Secondary | ICD-10-CM | POA: Diagnosis not present

## 2019-12-02 DIAGNOSIS — R7981 Abnormal blood-gas level: Secondary | ICD-10-CM | POA: Diagnosis not present

## 2019-12-02 DIAGNOSIS — D518 Other vitamin B12 deficiency anemias: Secondary | ICD-10-CM | POA: Diagnosis not present

## 2020-01-05 DIAGNOSIS — F0150 Vascular dementia without behavioral disturbance: Secondary | ICD-10-CM | POA: Diagnosis not present

## 2020-01-05 DIAGNOSIS — N1831 Chronic kidney disease, stage 3a: Secondary | ICD-10-CM | POA: Diagnosis not present

## 2020-01-05 DIAGNOSIS — I152 Hypertension secondary to endocrine disorders: Secondary | ICD-10-CM | POA: Diagnosis not present

## 2020-01-05 DIAGNOSIS — E1159 Type 2 diabetes mellitus with other circulatory complications: Secondary | ICD-10-CM | POA: Diagnosis not present

## 2020-01-19 DIAGNOSIS — E43 Unspecified severe protein-calorie malnutrition: Secondary | ICD-10-CM | POA: Diagnosis not present

## 2020-01-19 DIAGNOSIS — R4189 Other symptoms and signs involving cognitive functions and awareness: Secondary | ICD-10-CM | POA: Diagnosis not present

## 2020-01-30 DIAGNOSIS — M79676 Pain in unspecified toe(s): Secondary | ICD-10-CM | POA: Diagnosis not present

## 2020-01-30 DIAGNOSIS — M79672 Pain in left foot: Secondary | ICD-10-CM | POA: Diagnosis not present

## 2020-02-02 DIAGNOSIS — R1312 Dysphagia, oropharyngeal phase: Secondary | ICD-10-CM | POA: Diagnosis not present

## 2020-02-02 DIAGNOSIS — G9341 Metabolic encephalopathy: Secondary | ICD-10-CM | POA: Diagnosis not present

## 2020-02-02 DIAGNOSIS — R41841 Cognitive communication deficit: Secondary | ICD-10-CM | POA: Diagnosis not present

## 2020-02-02 DIAGNOSIS — M6281 Muscle weakness (generalized): Secondary | ICD-10-CM | POA: Diagnosis not present

## 2020-02-02 DIAGNOSIS — R278 Other lack of coordination: Secondary | ICD-10-CM | POA: Diagnosis not present

## 2020-02-02 DIAGNOSIS — Z741 Need for assistance with personal care: Secondary | ICD-10-CM | POA: Diagnosis not present

## 2020-02-02 DIAGNOSIS — E1122 Type 2 diabetes mellitus with diabetic chronic kidney disease: Secondary | ICD-10-CM | POA: Diagnosis not present

## 2020-02-02 DIAGNOSIS — R2681 Unsteadiness on feet: Secondary | ICD-10-CM | POA: Diagnosis not present

## 2020-02-07 DIAGNOSIS — R2681 Unsteadiness on feet: Secondary | ICD-10-CM | POA: Diagnosis not present

## 2020-02-07 DIAGNOSIS — E1122 Type 2 diabetes mellitus with diabetic chronic kidney disease: Secondary | ICD-10-CM | POA: Diagnosis not present

## 2020-02-07 DIAGNOSIS — I152 Hypertension secondary to endocrine disorders: Secondary | ICD-10-CM | POA: Diagnosis not present

## 2020-02-07 DIAGNOSIS — M6281 Muscle weakness (generalized): Secondary | ICD-10-CM | POA: Diagnosis not present

## 2020-02-07 DIAGNOSIS — R278 Other lack of coordination: Secondary | ICD-10-CM | POA: Diagnosis not present

## 2020-02-07 DIAGNOSIS — E1159 Type 2 diabetes mellitus with other circulatory complications: Secondary | ICD-10-CM | POA: Diagnosis not present

## 2020-02-07 DIAGNOSIS — G9341 Metabolic encephalopathy: Secondary | ICD-10-CM | POA: Diagnosis not present

## 2020-02-07 DIAGNOSIS — N1831 Chronic kidney disease, stage 3a: Secondary | ICD-10-CM | POA: Diagnosis not present

## 2020-02-07 DIAGNOSIS — E43 Unspecified severe protein-calorie malnutrition: Secondary | ICD-10-CM | POA: Diagnosis not present

## 2020-02-07 DIAGNOSIS — Z741 Need for assistance with personal care: Secondary | ICD-10-CM | POA: Diagnosis not present

## 2020-02-07 DIAGNOSIS — R41841 Cognitive communication deficit: Secondary | ICD-10-CM | POA: Diagnosis not present

## 2020-02-07 DIAGNOSIS — R1312 Dysphagia, oropharyngeal phase: Secondary | ICD-10-CM | POA: Diagnosis not present

## 2020-02-07 DIAGNOSIS — F0150 Vascular dementia without behavioral disturbance: Secondary | ICD-10-CM | POA: Diagnosis not present

## 2020-02-08 DIAGNOSIS — R41841 Cognitive communication deficit: Secondary | ICD-10-CM | POA: Diagnosis not present

## 2020-02-08 DIAGNOSIS — E1122 Type 2 diabetes mellitus with diabetic chronic kidney disease: Secondary | ICD-10-CM | POA: Diagnosis not present

## 2020-02-08 DIAGNOSIS — R1312 Dysphagia, oropharyngeal phase: Secondary | ICD-10-CM | POA: Diagnosis not present

## 2020-02-08 DIAGNOSIS — Z741 Need for assistance with personal care: Secondary | ICD-10-CM | POA: Diagnosis not present

## 2020-02-08 DIAGNOSIS — G9341 Metabolic encephalopathy: Secondary | ICD-10-CM | POA: Diagnosis not present

## 2020-02-08 DIAGNOSIS — R2681 Unsteadiness on feet: Secondary | ICD-10-CM | POA: Diagnosis not present

## 2020-02-08 DIAGNOSIS — M6281 Muscle weakness (generalized): Secondary | ICD-10-CM | POA: Diagnosis not present

## 2020-02-08 DIAGNOSIS — R278 Other lack of coordination: Secondary | ICD-10-CM | POA: Diagnosis not present

## 2020-02-09 DIAGNOSIS — E1122 Type 2 diabetes mellitus with diabetic chronic kidney disease: Secondary | ICD-10-CM | POA: Diagnosis not present

## 2020-02-09 DIAGNOSIS — R278 Other lack of coordination: Secondary | ICD-10-CM | POA: Diagnosis not present

## 2020-02-09 DIAGNOSIS — G9341 Metabolic encephalopathy: Secondary | ICD-10-CM | POA: Diagnosis not present

## 2020-02-09 DIAGNOSIS — M6281 Muscle weakness (generalized): Secondary | ICD-10-CM | POA: Diagnosis not present

## 2020-02-09 DIAGNOSIS — R1312 Dysphagia, oropharyngeal phase: Secondary | ICD-10-CM | POA: Diagnosis not present

## 2020-02-09 DIAGNOSIS — R41841 Cognitive communication deficit: Secondary | ICD-10-CM | POA: Diagnosis not present

## 2020-02-09 DIAGNOSIS — Z741 Need for assistance with personal care: Secondary | ICD-10-CM | POA: Diagnosis not present

## 2020-02-09 DIAGNOSIS — R2681 Unsteadiness on feet: Secondary | ICD-10-CM | POA: Diagnosis not present

## 2020-02-10 DIAGNOSIS — I10 Essential (primary) hypertension: Secondary | ICD-10-CM | POA: Diagnosis not present

## 2020-02-10 DIAGNOSIS — E785 Hyperlipidemia, unspecified: Secondary | ICD-10-CM | POA: Diagnosis not present

## 2020-02-10 DIAGNOSIS — E1122 Type 2 diabetes mellitus with diabetic chronic kidney disease: Secondary | ICD-10-CM | POA: Diagnosis not present

## 2020-02-10 DIAGNOSIS — N400 Enlarged prostate without lower urinary tract symptoms: Secondary | ICD-10-CM | POA: Diagnosis not present

## 2020-02-10 DIAGNOSIS — R41841 Cognitive communication deficit: Secondary | ICD-10-CM | POA: Diagnosis not present

## 2020-02-10 DIAGNOSIS — R1312 Dysphagia, oropharyngeal phase: Secondary | ICD-10-CM | POA: Diagnosis not present

## 2020-02-10 DIAGNOSIS — Z741 Need for assistance with personal care: Secondary | ICD-10-CM | POA: Diagnosis not present

## 2020-02-10 DIAGNOSIS — G9341 Metabolic encephalopathy: Secondary | ICD-10-CM | POA: Diagnosis not present

## 2020-02-10 DIAGNOSIS — R2681 Unsteadiness on feet: Secondary | ICD-10-CM | POA: Diagnosis not present

## 2020-02-10 DIAGNOSIS — R278 Other lack of coordination: Secondary | ICD-10-CM | POA: Diagnosis not present

## 2020-02-10 DIAGNOSIS — E1159 Type 2 diabetes mellitus with other circulatory complications: Secondary | ICD-10-CM | POA: Diagnosis not present

## 2020-02-10 DIAGNOSIS — M6281 Muscle weakness (generalized): Secondary | ICD-10-CM | POA: Diagnosis not present

## 2020-02-12 DIAGNOSIS — Z741 Need for assistance with personal care: Secondary | ICD-10-CM | POA: Diagnosis not present

## 2020-02-12 DIAGNOSIS — R41841 Cognitive communication deficit: Secondary | ICD-10-CM | POA: Diagnosis not present

## 2020-02-12 DIAGNOSIS — E1122 Type 2 diabetes mellitus with diabetic chronic kidney disease: Secondary | ICD-10-CM | POA: Diagnosis not present

## 2020-02-12 DIAGNOSIS — R278 Other lack of coordination: Secondary | ICD-10-CM | POA: Diagnosis not present

## 2020-02-12 DIAGNOSIS — R2681 Unsteadiness on feet: Secondary | ICD-10-CM | POA: Diagnosis not present

## 2020-02-12 DIAGNOSIS — R1312 Dysphagia, oropharyngeal phase: Secondary | ICD-10-CM | POA: Diagnosis not present

## 2020-02-12 DIAGNOSIS — G9341 Metabolic encephalopathy: Secondary | ICD-10-CM | POA: Diagnosis not present

## 2020-02-12 DIAGNOSIS — M6281 Muscle weakness (generalized): Secondary | ICD-10-CM | POA: Diagnosis not present

## 2020-02-13 DIAGNOSIS — F0150 Vascular dementia without behavioral disturbance: Secondary | ICD-10-CM | POA: Diagnosis not present

## 2020-02-13 DIAGNOSIS — R278 Other lack of coordination: Secondary | ICD-10-CM | POA: Diagnosis not present

## 2020-02-13 DIAGNOSIS — I10 Essential (primary) hypertension: Secondary | ICD-10-CM | POA: Diagnosis not present

## 2020-02-13 DIAGNOSIS — K219 Gastro-esophageal reflux disease without esophagitis: Secondary | ICD-10-CM | POA: Diagnosis not present

## 2020-02-13 DIAGNOSIS — E785 Hyperlipidemia, unspecified: Secondary | ICD-10-CM | POA: Diagnosis not present

## 2020-02-13 DIAGNOSIS — G9341 Metabolic encephalopathy: Secondary | ICD-10-CM | POA: Diagnosis not present

## 2020-02-13 DIAGNOSIS — R2689 Other abnormalities of gait and mobility: Secondary | ICD-10-CM | POA: Diagnosis not present

## 2020-02-13 DIAGNOSIS — E46 Unspecified protein-calorie malnutrition: Secondary | ICD-10-CM | POA: Diagnosis not present

## 2020-02-13 DIAGNOSIS — N400 Enlarged prostate without lower urinary tract symptoms: Secondary | ICD-10-CM | POA: Diagnosis not present

## 2020-02-13 DIAGNOSIS — E119 Type 2 diabetes mellitus without complications: Secondary | ICD-10-CM | POA: Diagnosis not present

## 2020-02-14 DIAGNOSIS — I10 Essential (primary) hypertension: Secondary | ICD-10-CM | POA: Diagnosis not present

## 2020-02-14 DIAGNOSIS — R278 Other lack of coordination: Secondary | ICD-10-CM | POA: Diagnosis not present

## 2020-02-14 DIAGNOSIS — E46 Unspecified protein-calorie malnutrition: Secondary | ICD-10-CM | POA: Diagnosis not present

## 2020-02-14 DIAGNOSIS — F0150 Vascular dementia without behavioral disturbance: Secondary | ICD-10-CM | POA: Diagnosis not present

## 2020-02-14 DIAGNOSIS — K219 Gastro-esophageal reflux disease without esophagitis: Secondary | ICD-10-CM | POA: Diagnosis not present

## 2020-02-14 DIAGNOSIS — N400 Enlarged prostate without lower urinary tract symptoms: Secondary | ICD-10-CM | POA: Diagnosis not present

## 2020-02-14 DIAGNOSIS — E785 Hyperlipidemia, unspecified: Secondary | ICD-10-CM | POA: Diagnosis not present

## 2020-02-14 DIAGNOSIS — R2689 Other abnormalities of gait and mobility: Secondary | ICD-10-CM | POA: Diagnosis not present

## 2020-02-14 DIAGNOSIS — E119 Type 2 diabetes mellitus without complications: Secondary | ICD-10-CM | POA: Diagnosis not present

## 2020-02-14 DIAGNOSIS — G9341 Metabolic encephalopathy: Secondary | ICD-10-CM | POA: Diagnosis not present

## 2020-02-15 DIAGNOSIS — R278 Other lack of coordination: Secondary | ICD-10-CM | POA: Diagnosis not present

## 2020-02-15 DIAGNOSIS — G9341 Metabolic encephalopathy: Secondary | ICD-10-CM | POA: Diagnosis not present

## 2020-02-15 DIAGNOSIS — R2689 Other abnormalities of gait and mobility: Secondary | ICD-10-CM | POA: Diagnosis not present

## 2020-02-16 DIAGNOSIS — R2689 Other abnormalities of gait and mobility: Secondary | ICD-10-CM | POA: Diagnosis not present

## 2020-02-16 DIAGNOSIS — G9341 Metabolic encephalopathy: Secondary | ICD-10-CM | POA: Diagnosis not present

## 2020-02-16 DIAGNOSIS — R278 Other lack of coordination: Secondary | ICD-10-CM | POA: Diagnosis not present

## 2020-02-17 DIAGNOSIS — R2689 Other abnormalities of gait and mobility: Secondary | ICD-10-CM | POA: Diagnosis not present

## 2020-02-17 DIAGNOSIS — G9341 Metabolic encephalopathy: Secondary | ICD-10-CM | POA: Diagnosis not present

## 2020-02-17 DIAGNOSIS — R278 Other lack of coordination: Secondary | ICD-10-CM | POA: Diagnosis not present

## 2020-02-20 DIAGNOSIS — G9341 Metabolic encephalopathy: Secondary | ICD-10-CM | POA: Diagnosis not present

## 2020-02-20 DIAGNOSIS — R278 Other lack of coordination: Secondary | ICD-10-CM | POA: Diagnosis not present

## 2020-02-20 DIAGNOSIS — R2689 Other abnormalities of gait and mobility: Secondary | ICD-10-CM | POA: Diagnosis not present

## 2020-02-21 DIAGNOSIS — G9341 Metabolic encephalopathy: Secondary | ICD-10-CM | POA: Diagnosis not present

## 2020-02-21 DIAGNOSIS — R278 Other lack of coordination: Secondary | ICD-10-CM | POA: Diagnosis not present

## 2020-02-21 DIAGNOSIS — R2689 Other abnormalities of gait and mobility: Secondary | ICD-10-CM | POA: Diagnosis not present

## 2020-02-22 DIAGNOSIS — R2689 Other abnormalities of gait and mobility: Secondary | ICD-10-CM | POA: Diagnosis not present

## 2020-02-22 DIAGNOSIS — R278 Other lack of coordination: Secondary | ICD-10-CM | POA: Diagnosis not present

## 2020-02-22 DIAGNOSIS — G9341 Metabolic encephalopathy: Secondary | ICD-10-CM | POA: Diagnosis not present

## 2020-02-23 DIAGNOSIS — R278 Other lack of coordination: Secondary | ICD-10-CM | POA: Diagnosis not present

## 2020-02-23 DIAGNOSIS — E785 Hyperlipidemia, unspecified: Secondary | ICD-10-CM | POA: Diagnosis not present

## 2020-02-23 DIAGNOSIS — F0150 Vascular dementia without behavioral disturbance: Secondary | ICD-10-CM | POA: Diagnosis not present

## 2020-02-23 DIAGNOSIS — I10 Essential (primary) hypertension: Secondary | ICD-10-CM | POA: Diagnosis not present

## 2020-02-23 DIAGNOSIS — K219 Gastro-esophageal reflux disease without esophagitis: Secondary | ICD-10-CM | POA: Diagnosis not present

## 2020-02-23 DIAGNOSIS — N400 Enlarged prostate without lower urinary tract symptoms: Secondary | ICD-10-CM | POA: Diagnosis not present

## 2020-02-23 DIAGNOSIS — R2689 Other abnormalities of gait and mobility: Secondary | ICD-10-CM | POA: Diagnosis not present

## 2020-02-23 DIAGNOSIS — E119 Type 2 diabetes mellitus without complications: Secondary | ICD-10-CM | POA: Diagnosis not present

## 2020-02-23 DIAGNOSIS — G9341 Metabolic encephalopathy: Secondary | ICD-10-CM | POA: Diagnosis not present

## 2020-02-23 DIAGNOSIS — E46 Unspecified protein-calorie malnutrition: Secondary | ICD-10-CM | POA: Diagnosis not present

## 2020-02-24 DIAGNOSIS — R278 Other lack of coordination: Secondary | ICD-10-CM | POA: Diagnosis not present

## 2020-02-24 DIAGNOSIS — G9341 Metabolic encephalopathy: Secondary | ICD-10-CM | POA: Diagnosis not present

## 2020-02-24 DIAGNOSIS — R2689 Other abnormalities of gait and mobility: Secondary | ICD-10-CM | POA: Diagnosis not present

## 2020-02-25 DIAGNOSIS — R278 Other lack of coordination: Secondary | ICD-10-CM | POA: Diagnosis not present

## 2020-02-25 DIAGNOSIS — G9341 Metabolic encephalopathy: Secondary | ICD-10-CM | POA: Diagnosis not present

## 2020-02-25 DIAGNOSIS — R2689 Other abnormalities of gait and mobility: Secondary | ICD-10-CM | POA: Diagnosis not present

## 2020-02-27 ENCOUNTER — Telehealth: Payer: Self-pay | Admitting: Family Medicine

## 2020-02-27 DIAGNOSIS — R2689 Other abnormalities of gait and mobility: Secondary | ICD-10-CM | POA: Diagnosis not present

## 2020-02-27 DIAGNOSIS — G9341 Metabolic encephalopathy: Secondary | ICD-10-CM | POA: Diagnosis not present

## 2020-02-27 DIAGNOSIS — R278 Other lack of coordination: Secondary | ICD-10-CM | POA: Diagnosis not present

## 2020-02-27 NOTE — Telephone Encounter (Signed)
Copied from CRM [id number]. Topic: Medicare AWV >> Feb 27, 2020  3:45 PM Claudette Laws R wrote: Reason for CRM:  No answer unable to leave a message for patient to call back and schedule Medicare Annual Wellness Visit (AWV) in office.   If not able to come in office, please offer to do virtually or by telephone.   Last AWV 10/22/2017  Please schedule at anytime with [facility] Health Advisor.  If any questions, please contact me at [phone]

## 2020-02-28 DIAGNOSIS — G9341 Metabolic encephalopathy: Secondary | ICD-10-CM | POA: Diagnosis not present

## 2020-02-28 DIAGNOSIS — R278 Other lack of coordination: Secondary | ICD-10-CM | POA: Diagnosis not present

## 2020-02-28 DIAGNOSIS — R2689 Other abnormalities of gait and mobility: Secondary | ICD-10-CM | POA: Diagnosis not present

## 2020-02-29 DIAGNOSIS — R2689 Other abnormalities of gait and mobility: Secondary | ICD-10-CM | POA: Diagnosis not present

## 2020-02-29 DIAGNOSIS — G9341 Metabolic encephalopathy: Secondary | ICD-10-CM | POA: Diagnosis not present

## 2020-02-29 DIAGNOSIS — R278 Other lack of coordination: Secondary | ICD-10-CM | POA: Diagnosis not present

## 2020-03-01 DIAGNOSIS — G9341 Metabolic encephalopathy: Secondary | ICD-10-CM | POA: Diagnosis not present

## 2020-03-01 DIAGNOSIS — R278 Other lack of coordination: Secondary | ICD-10-CM | POA: Diagnosis not present

## 2020-03-01 DIAGNOSIS — R2689 Other abnormalities of gait and mobility: Secondary | ICD-10-CM | POA: Diagnosis not present

## 2020-03-02 DIAGNOSIS — R278 Other lack of coordination: Secondary | ICD-10-CM | POA: Diagnosis not present

## 2020-03-02 DIAGNOSIS — G9341 Metabolic encephalopathy: Secondary | ICD-10-CM | POA: Diagnosis not present

## 2020-03-02 DIAGNOSIS — R2689 Other abnormalities of gait and mobility: Secondary | ICD-10-CM | POA: Diagnosis not present

## 2020-03-05 DIAGNOSIS — R2689 Other abnormalities of gait and mobility: Secondary | ICD-10-CM | POA: Diagnosis not present

## 2020-03-05 DIAGNOSIS — G9341 Metabolic encephalopathy: Secondary | ICD-10-CM | POA: Diagnosis not present

## 2020-03-05 DIAGNOSIS — R278 Other lack of coordination: Secondary | ICD-10-CM | POA: Diagnosis not present

## 2020-03-06 DIAGNOSIS — G9341 Metabolic encephalopathy: Secondary | ICD-10-CM | POA: Diagnosis not present

## 2020-03-06 DIAGNOSIS — R278 Other lack of coordination: Secondary | ICD-10-CM | POA: Diagnosis not present

## 2020-03-07 DIAGNOSIS — R278 Other lack of coordination: Secondary | ICD-10-CM | POA: Diagnosis not present

## 2020-03-07 DIAGNOSIS — G9341 Metabolic encephalopathy: Secondary | ICD-10-CM | POA: Diagnosis not present

## 2020-03-08 DIAGNOSIS — R278 Other lack of coordination: Secondary | ICD-10-CM | POA: Diagnosis not present

## 2020-03-08 DIAGNOSIS — G9341 Metabolic encephalopathy: Secondary | ICD-10-CM | POA: Diagnosis not present

## 2020-03-09 DIAGNOSIS — E119 Type 2 diabetes mellitus without complications: Secondary | ICD-10-CM | POA: Diagnosis not present

## 2020-03-09 DIAGNOSIS — N400 Enlarged prostate without lower urinary tract symptoms: Secondary | ICD-10-CM | POA: Diagnosis not present

## 2020-03-09 DIAGNOSIS — E785 Hyperlipidemia, unspecified: Secondary | ICD-10-CM | POA: Diagnosis not present

## 2020-03-09 DIAGNOSIS — I10 Essential (primary) hypertension: Secondary | ICD-10-CM | POA: Diagnosis not present

## 2020-03-09 DIAGNOSIS — E46 Unspecified protein-calorie malnutrition: Secondary | ICD-10-CM | POA: Diagnosis not present

## 2020-03-09 DIAGNOSIS — F0150 Vascular dementia without behavioral disturbance: Secondary | ICD-10-CM | POA: Diagnosis not present

## 2020-03-09 DIAGNOSIS — K219 Gastro-esophageal reflux disease without esophagitis: Secondary | ICD-10-CM | POA: Diagnosis not present

## 2020-03-09 DIAGNOSIS — G9341 Metabolic encephalopathy: Secondary | ICD-10-CM | POA: Diagnosis not present

## 2020-03-09 DIAGNOSIS — R278 Other lack of coordination: Secondary | ICD-10-CM | POA: Diagnosis not present

## 2020-03-15 DIAGNOSIS — K5900 Constipation, unspecified: Secondary | ICD-10-CM | POA: Diagnosis not present

## 2020-03-21 DIAGNOSIS — K5900 Constipation, unspecified: Secondary | ICD-10-CM | POA: Diagnosis not present

## 2020-04-11 DIAGNOSIS — K219 Gastro-esophageal reflux disease without esophagitis: Secondary | ICD-10-CM | POA: Diagnosis not present

## 2020-04-11 DIAGNOSIS — E785 Hyperlipidemia, unspecified: Secondary | ICD-10-CM | POA: Diagnosis not present

## 2020-04-11 DIAGNOSIS — F0150 Vascular dementia without behavioral disturbance: Secondary | ICD-10-CM | POA: Diagnosis not present

## 2020-04-11 DIAGNOSIS — I10 Essential (primary) hypertension: Secondary | ICD-10-CM | POA: Diagnosis not present

## 2020-04-11 DIAGNOSIS — E119 Type 2 diabetes mellitus without complications: Secondary | ICD-10-CM | POA: Diagnosis not present

## 2020-04-11 DIAGNOSIS — E46 Unspecified protein-calorie malnutrition: Secondary | ICD-10-CM | POA: Diagnosis not present

## 2020-04-11 DIAGNOSIS — N400 Enlarged prostate without lower urinary tract symptoms: Secondary | ICD-10-CM | POA: Diagnosis not present

## 2020-04-16 DIAGNOSIS — I739 Peripheral vascular disease, unspecified: Secondary | ICD-10-CM | POA: Diagnosis not present

## 2020-04-16 DIAGNOSIS — L603 Nail dystrophy: Secondary | ICD-10-CM | POA: Diagnosis not present

## 2020-04-16 DIAGNOSIS — B351 Tinea unguium: Secondary | ICD-10-CM | POA: Diagnosis not present

## 2020-04-16 DIAGNOSIS — G629 Polyneuropathy, unspecified: Secondary | ICD-10-CM | POA: Diagnosis not present

## 2020-04-18 DIAGNOSIS — E119 Type 2 diabetes mellitus without complications: Secondary | ICD-10-CM | POA: Diagnosis not present

## 2020-04-18 DIAGNOSIS — I10 Essential (primary) hypertension: Secondary | ICD-10-CM | POA: Diagnosis not present

## 2020-04-18 DIAGNOSIS — N400 Enlarged prostate without lower urinary tract symptoms: Secondary | ICD-10-CM | POA: Diagnosis not present

## 2020-04-18 DIAGNOSIS — E46 Unspecified protein-calorie malnutrition: Secondary | ICD-10-CM | POA: Diagnosis not present

## 2020-04-18 DIAGNOSIS — E785 Hyperlipidemia, unspecified: Secondary | ICD-10-CM | POA: Diagnosis not present

## 2020-04-18 DIAGNOSIS — F0150 Vascular dementia without behavioral disturbance: Secondary | ICD-10-CM | POA: Diagnosis not present

## 2020-04-18 DIAGNOSIS — K219 Gastro-esophageal reflux disease without esophagitis: Secondary | ICD-10-CM | POA: Diagnosis not present

## 2020-04-26 DIAGNOSIS — F0150 Vascular dementia without behavioral disturbance: Secondary | ICD-10-CM | POA: Diagnosis not present

## 2020-04-26 DIAGNOSIS — N400 Enlarged prostate without lower urinary tract symptoms: Secondary | ICD-10-CM | POA: Diagnosis not present

## 2020-04-26 DIAGNOSIS — E785 Hyperlipidemia, unspecified: Secondary | ICD-10-CM | POA: Diagnosis not present

## 2020-04-26 DIAGNOSIS — E46 Unspecified protein-calorie malnutrition: Secondary | ICD-10-CM | POA: Diagnosis not present

## 2020-04-26 DIAGNOSIS — I10 Essential (primary) hypertension: Secondary | ICD-10-CM | POA: Diagnosis not present

## 2020-04-26 DIAGNOSIS — E119 Type 2 diabetes mellitus without complications: Secondary | ICD-10-CM | POA: Diagnosis not present

## 2020-04-30 DIAGNOSIS — E43 Unspecified severe protein-calorie malnutrition: Secondary | ICD-10-CM | POA: Diagnosis not present

## 2020-04-30 DIAGNOSIS — E785 Hyperlipidemia, unspecified: Secondary | ICD-10-CM | POA: Diagnosis not present

## 2020-04-30 DIAGNOSIS — E1122 Type 2 diabetes mellitus with diabetic chronic kidney disease: Secondary | ICD-10-CM | POA: Diagnosis not present

## 2020-05-09 DIAGNOSIS — K219 Gastro-esophageal reflux disease without esophagitis: Secondary | ICD-10-CM | POA: Diagnosis not present

## 2020-05-09 DIAGNOSIS — E785 Hyperlipidemia, unspecified: Secondary | ICD-10-CM | POA: Diagnosis not present

## 2020-05-09 DIAGNOSIS — N400 Enlarged prostate without lower urinary tract symptoms: Secondary | ICD-10-CM | POA: Diagnosis not present

## 2020-05-09 DIAGNOSIS — F0150 Vascular dementia without behavioral disturbance: Secondary | ICD-10-CM | POA: Diagnosis not present

## 2020-05-09 DIAGNOSIS — I10 Essential (primary) hypertension: Secondary | ICD-10-CM | POA: Diagnosis not present

## 2020-05-11 DIAGNOSIS — R488 Other symbolic dysfunctions: Secondary | ICD-10-CM | POA: Diagnosis not present

## 2020-05-11 DIAGNOSIS — R41841 Cognitive communication deficit: Secondary | ICD-10-CM | POA: Diagnosis not present

## 2020-05-11 DIAGNOSIS — I69991 Dysphagia following unspecified cerebrovascular disease: Secondary | ICD-10-CM | POA: Diagnosis not present

## 2020-05-14 DIAGNOSIS — R41841 Cognitive communication deficit: Secondary | ICD-10-CM | POA: Diagnosis not present

## 2020-05-14 DIAGNOSIS — I69991 Dysphagia following unspecified cerebrovascular disease: Secondary | ICD-10-CM | POA: Diagnosis not present

## 2020-05-14 DIAGNOSIS — R488 Other symbolic dysfunctions: Secondary | ICD-10-CM | POA: Diagnosis not present

## 2020-05-15 DIAGNOSIS — R488 Other symbolic dysfunctions: Secondary | ICD-10-CM | POA: Diagnosis not present

## 2020-05-15 DIAGNOSIS — I69991 Dysphagia following unspecified cerebrovascular disease: Secondary | ICD-10-CM | POA: Diagnosis not present

## 2020-05-15 DIAGNOSIS — R41841 Cognitive communication deficit: Secondary | ICD-10-CM | POA: Diagnosis not present

## 2020-05-16 DIAGNOSIS — R488 Other symbolic dysfunctions: Secondary | ICD-10-CM | POA: Diagnosis not present

## 2020-05-16 DIAGNOSIS — I69991 Dysphagia following unspecified cerebrovascular disease: Secondary | ICD-10-CM | POA: Diagnosis not present

## 2020-05-16 DIAGNOSIS — R41841 Cognitive communication deficit: Secondary | ICD-10-CM | POA: Diagnosis not present

## 2020-05-17 DIAGNOSIS — R488 Other symbolic dysfunctions: Secondary | ICD-10-CM | POA: Diagnosis not present

## 2020-05-17 DIAGNOSIS — I69991 Dysphagia following unspecified cerebrovascular disease: Secondary | ICD-10-CM | POA: Diagnosis not present

## 2020-05-17 DIAGNOSIS — R41841 Cognitive communication deficit: Secondary | ICD-10-CM | POA: Diagnosis not present

## 2020-05-18 DIAGNOSIS — R41841 Cognitive communication deficit: Secondary | ICD-10-CM | POA: Diagnosis not present

## 2020-05-18 DIAGNOSIS — R488 Other symbolic dysfunctions: Secondary | ICD-10-CM | POA: Diagnosis not present

## 2020-05-18 DIAGNOSIS — I69991 Dysphagia following unspecified cerebrovascular disease: Secondary | ICD-10-CM | POA: Diagnosis not present

## 2020-05-21 DIAGNOSIS — I69991 Dysphagia following unspecified cerebrovascular disease: Secondary | ICD-10-CM | POA: Diagnosis not present

## 2020-05-21 DIAGNOSIS — R41841 Cognitive communication deficit: Secondary | ICD-10-CM | POA: Diagnosis not present

## 2020-05-21 DIAGNOSIS — R488 Other symbolic dysfunctions: Secondary | ICD-10-CM | POA: Diagnosis not present

## 2020-06-11 DIAGNOSIS — N400 Enlarged prostate without lower urinary tract symptoms: Secondary | ICD-10-CM | POA: Diagnosis not present

## 2020-06-11 DIAGNOSIS — E119 Type 2 diabetes mellitus without complications: Secondary | ICD-10-CM | POA: Diagnosis not present

## 2020-06-11 DIAGNOSIS — E785 Hyperlipidemia, unspecified: Secondary | ICD-10-CM | POA: Diagnosis not present

## 2020-06-11 DIAGNOSIS — K5900 Constipation, unspecified: Secondary | ICD-10-CM | POA: Diagnosis not present

## 2020-06-11 DIAGNOSIS — K219 Gastro-esophageal reflux disease without esophagitis: Secondary | ICD-10-CM | POA: Diagnosis not present

## 2020-06-11 DIAGNOSIS — I10 Essential (primary) hypertension: Secondary | ICD-10-CM | POA: Diagnosis not present

## 2020-06-11 DIAGNOSIS — F0150 Vascular dementia without behavioral disturbance: Secondary | ICD-10-CM | POA: Diagnosis not present

## 2020-07-05 DIAGNOSIS — G629 Polyneuropathy, unspecified: Secondary | ICD-10-CM | POA: Diagnosis not present

## 2020-07-05 DIAGNOSIS — B351 Tinea unguium: Secondary | ICD-10-CM | POA: Diagnosis not present

## 2020-07-05 DIAGNOSIS — I739 Peripheral vascular disease, unspecified: Secondary | ICD-10-CM | POA: Diagnosis not present

## 2020-07-05 DIAGNOSIS — L603 Nail dystrophy: Secondary | ICD-10-CM | POA: Diagnosis not present

## 2020-07-13 DIAGNOSIS — E785 Hyperlipidemia, unspecified: Secondary | ICD-10-CM | POA: Diagnosis not present

## 2020-07-13 DIAGNOSIS — N400 Enlarged prostate without lower urinary tract symptoms: Secondary | ICD-10-CM | POA: Diagnosis not present

## 2020-07-13 DIAGNOSIS — N189 Chronic kidney disease, unspecified: Secondary | ICD-10-CM | POA: Diagnosis not present

## 2020-07-13 DIAGNOSIS — I10 Essential (primary) hypertension: Secondary | ICD-10-CM | POA: Diagnosis not present

## 2020-07-13 DIAGNOSIS — F0150 Vascular dementia without behavioral disturbance: Secondary | ICD-10-CM | POA: Diagnosis not present

## 2020-07-13 DIAGNOSIS — K219 Gastro-esophageal reflux disease without esophagitis: Secondary | ICD-10-CM | POA: Diagnosis not present

## 2020-07-13 DIAGNOSIS — E119 Type 2 diabetes mellitus without complications: Secondary | ICD-10-CM | POA: Diagnosis not present

## 2020-07-13 DIAGNOSIS — D649 Anemia, unspecified: Secondary | ICD-10-CM | POA: Diagnosis not present

## 2020-07-17 DIAGNOSIS — E785 Hyperlipidemia, unspecified: Secondary | ICD-10-CM | POA: Diagnosis not present

## 2020-07-17 DIAGNOSIS — E1122 Type 2 diabetes mellitus with diabetic chronic kidney disease: Secondary | ICD-10-CM | POA: Diagnosis not present

## 2020-07-24 ENCOUNTER — Telehealth: Payer: Self-pay | Admitting: *Deleted

## 2020-07-24 NOTE — Chronic Care Management (AMB) (Signed)
  Chronic Care Management   Note  07/24/2020 Name: Ernest Lewis MRN: [id number] DOB: [birth year]  Ernest Lewis is a 80 y.o. year old male who is a primary care patient of Caryn Section, Kirstie Peri, MD. I reached out to Jena Gauss by phone today in response to a referral sent by Ernest Lewis's PCP Birdie Sons, MD     Ernest Lewis was given information about Chronic Care Management services today including:  CCM service includes personalized support from designated clinical staff supervised by his physician, including individualized plan of care and coordination with other care providers 24/7 contact phone numbers for assistance for urgent and routine care needs. Service will only be billed when office clinical staff spend 20 minutes or more in a month to coordinate care. Only one practitioner may furnish and bill the service in a calendar month. The patient may stop CCM services at any time (effective at the end of the month) by phone call to the office staff. The patient will be responsible for cost sharing (co-pay) of up to 20% of the service fee (after annual deductible is met).  Patient did not agree to enrollment in care management services and does not wish to consider at this time. Ernest Lewis per daughter Ernest Lewis) is in SNF   Follow up plan: Patient declines further follow up and engagement by the care management team. Appropriate care team members and provider have been notified via electronic communication.   Julian Hy, [place] Management  Direct Dial: [phone]

## 2020-08-03 DIAGNOSIS — R2689 Other abnormalities of gait and mobility: Secondary | ICD-10-CM | POA: Diagnosis not present

## 2020-08-03 DIAGNOSIS — G9341 Metabolic encephalopathy: Secondary | ICD-10-CM | POA: Diagnosis not present

## 2020-08-03 DIAGNOSIS — I69991 Dysphagia following unspecified cerebrovascular disease: Secondary | ICD-10-CM | POA: Diagnosis not present

## 2020-08-03 DIAGNOSIS — R488 Other symbolic dysfunctions: Secondary | ICD-10-CM | POA: Diagnosis not present

## 2020-08-03 DIAGNOSIS — R41841 Cognitive communication deficit: Secondary | ICD-10-CM | POA: Diagnosis not present

## 2020-08-07 DIAGNOSIS — G9341 Metabolic encephalopathy: Secondary | ICD-10-CM | POA: Diagnosis not present

## 2020-08-07 DIAGNOSIS — R2689 Other abnormalities of gait and mobility: Secondary | ICD-10-CM | POA: Diagnosis not present

## 2020-08-08 DIAGNOSIS — G9341 Metabolic encephalopathy: Secondary | ICD-10-CM | POA: Diagnosis not present

## 2020-08-08 DIAGNOSIS — R2689 Other abnormalities of gait and mobility: Secondary | ICD-10-CM | POA: Diagnosis not present

## 2020-08-09 DIAGNOSIS — R2689 Other abnormalities of gait and mobility: Secondary | ICD-10-CM | POA: Diagnosis not present

## 2020-08-09 DIAGNOSIS — G9341 Metabolic encephalopathy: Secondary | ICD-10-CM | POA: Diagnosis not present

## 2020-08-10 DIAGNOSIS — F411 Generalized anxiety disorder: Secondary | ICD-10-CM | POA: Diagnosis not present

## 2020-08-10 DIAGNOSIS — R2689 Other abnormalities of gait and mobility: Secondary | ICD-10-CM | POA: Diagnosis not present

## 2020-08-10 DIAGNOSIS — F0390 Unspecified dementia without behavioral disturbance: Secondary | ICD-10-CM | POA: Diagnosis not present

## 2020-08-10 DIAGNOSIS — F329 Major depressive disorder, single episode, unspecified: Secondary | ICD-10-CM | POA: Diagnosis not present

## 2020-08-10 DIAGNOSIS — G9341 Metabolic encephalopathy: Secondary | ICD-10-CM | POA: Diagnosis not present

## 2020-08-13 DIAGNOSIS — R2689 Other abnormalities of gait and mobility: Secondary | ICD-10-CM | POA: Diagnosis not present

## 2020-08-13 DIAGNOSIS — G9341 Metabolic encephalopathy: Secondary | ICD-10-CM | POA: Diagnosis not present

## 2020-08-14 DIAGNOSIS — G9341 Metabolic encephalopathy: Secondary | ICD-10-CM | POA: Diagnosis not present

## 2020-08-14 DIAGNOSIS — R2689 Other abnormalities of gait and mobility: Secondary | ICD-10-CM | POA: Diagnosis not present

## 2020-08-15 DIAGNOSIS — R2689 Other abnormalities of gait and mobility: Secondary | ICD-10-CM | POA: Diagnosis not present

## 2020-08-15 DIAGNOSIS — G9341 Metabolic encephalopathy: Secondary | ICD-10-CM | POA: Diagnosis not present

## 2020-08-15 DIAGNOSIS — F411 Generalized anxiety disorder: Secondary | ICD-10-CM | POA: Diagnosis not present

## 2020-08-15 DIAGNOSIS — F0390 Unspecified dementia without behavioral disturbance: Secondary | ICD-10-CM | POA: Diagnosis not present

## 2020-08-15 DIAGNOSIS — F329 Major depressive disorder, single episode, unspecified: Secondary | ICD-10-CM | POA: Diagnosis not present

## 2020-08-16 DIAGNOSIS — G9341 Metabolic encephalopathy: Secondary | ICD-10-CM | POA: Diagnosis not present

## 2020-08-16 DIAGNOSIS — R2689 Other abnormalities of gait and mobility: Secondary | ICD-10-CM | POA: Diagnosis not present

## 2020-08-17 DIAGNOSIS — G9341 Metabolic encephalopathy: Secondary | ICD-10-CM | POA: Diagnosis not present

## 2020-08-17 DIAGNOSIS — R2689 Other abnormalities of gait and mobility: Secondary | ICD-10-CM | POA: Diagnosis not present

## 2020-08-31 DIAGNOSIS — H16231 Neurotrophic keratoconjunctivitis, right eye: Secondary | ICD-10-CM | POA: Diagnosis not present

## 2020-08-31 DIAGNOSIS — H25812 Combined forms of age-related cataract, left eye: Secondary | ICD-10-CM | POA: Diagnosis not present

## 2020-09-12 DIAGNOSIS — I10 Essential (primary) hypertension: Secondary | ICD-10-CM | POA: Diagnosis not present

## 2020-09-12 DIAGNOSIS — F0150 Vascular dementia without behavioral disturbance: Secondary | ICD-10-CM | POA: Diagnosis not present

## 2020-09-12 DIAGNOSIS — K219 Gastro-esophageal reflux disease without esophagitis: Secondary | ICD-10-CM | POA: Diagnosis not present

## 2020-09-12 DIAGNOSIS — E785 Hyperlipidemia, unspecified: Secondary | ICD-10-CM | POA: Diagnosis not present

## 2020-09-12 DIAGNOSIS — N400 Enlarged prostate without lower urinary tract symptoms: Secondary | ICD-10-CM | POA: Diagnosis not present

## 2020-09-13 DIAGNOSIS — B351 Tinea unguium: Secondary | ICD-10-CM | POA: Diagnosis not present

## 2020-09-13 DIAGNOSIS — G629 Polyneuropathy, unspecified: Secondary | ICD-10-CM | POA: Diagnosis not present

## 2020-09-13 DIAGNOSIS — L603 Nail dystrophy: Secondary | ICD-10-CM | POA: Diagnosis not present

## 2020-09-13 DIAGNOSIS — I739 Peripheral vascular disease, unspecified: Secondary | ICD-10-CM | POA: Diagnosis not present

## 2020-11-02 DIAGNOSIS — Z741 Need for assistance with personal care: Secondary | ICD-10-CM | POA: Diagnosis not present

## 2020-11-02 DIAGNOSIS — F0150 Vascular dementia without behavioral disturbance: Secondary | ICD-10-CM | POA: Diagnosis not present

## 2020-11-02 DIAGNOSIS — R296 Repeated falls: Secondary | ICD-10-CM | POA: Diagnosis not present

## 2020-11-05 DIAGNOSIS — F03911 Unspecified dementia, unspecified severity, with agitation: Secondary | ICD-10-CM | POA: Diagnosis not present

## 2020-11-05 DIAGNOSIS — M6281 Muscle weakness (generalized): Secondary | ICD-10-CM | POA: Diagnosis not present

## 2020-11-06 DIAGNOSIS — R2681 Unsteadiness on feet: Secondary | ICD-10-CM | POA: Diagnosis not present

## 2020-11-06 DIAGNOSIS — F0150 Vascular dementia without behavioral disturbance: Secondary | ICD-10-CM | POA: Diagnosis not present

## 2020-11-06 DIAGNOSIS — Z741 Need for assistance with personal care: Secondary | ICD-10-CM | POA: Diagnosis not present

## 2020-11-07 DIAGNOSIS — Z741 Need for assistance with personal care: Secondary | ICD-10-CM | POA: Diagnosis not present

## 2020-11-07 DIAGNOSIS — F0150 Vascular dementia without behavioral disturbance: Secondary | ICD-10-CM | POA: Diagnosis not present

## 2020-11-07 DIAGNOSIS — R2681 Unsteadiness on feet: Secondary | ICD-10-CM | POA: Diagnosis not present

## 2020-11-08 DIAGNOSIS — Z741 Need for assistance with personal care: Secondary | ICD-10-CM | POA: Diagnosis not present

## 2020-11-08 DIAGNOSIS — R2681 Unsteadiness on feet: Secondary | ICD-10-CM | POA: Diagnosis not present

## 2020-11-08 DIAGNOSIS — F0150 Vascular dementia without behavioral disturbance: Secondary | ICD-10-CM | POA: Diagnosis not present

## 2020-11-09 DIAGNOSIS — Z741 Need for assistance with personal care: Secondary | ICD-10-CM | POA: Diagnosis not present

## 2020-11-09 DIAGNOSIS — F0150 Vascular dementia without behavioral disturbance: Secondary | ICD-10-CM | POA: Diagnosis not present

## 2020-11-09 DIAGNOSIS — R2681 Unsteadiness on feet: Secondary | ICD-10-CM | POA: Diagnosis not present

## 2020-11-11 DIAGNOSIS — R2681 Unsteadiness on feet: Secondary | ICD-10-CM | POA: Diagnosis not present

## 2020-11-11 DIAGNOSIS — Z741 Need for assistance with personal care: Secondary | ICD-10-CM | POA: Diagnosis not present

## 2020-11-11 DIAGNOSIS — F0150 Vascular dementia without behavioral disturbance: Secondary | ICD-10-CM | POA: Diagnosis not present

## 2020-11-12 DIAGNOSIS — R2681 Unsteadiness on feet: Secondary | ICD-10-CM | POA: Diagnosis not present

## 2020-11-12 DIAGNOSIS — F03911 Unspecified dementia, unspecified severity, with agitation: Secondary | ICD-10-CM | POA: Diagnosis not present

## 2020-11-12 DIAGNOSIS — F0150 Vascular dementia without behavioral disturbance: Secondary | ICD-10-CM | POA: Diagnosis not present

## 2020-11-12 DIAGNOSIS — Z741 Need for assistance with personal care: Secondary | ICD-10-CM | POA: Diagnosis not present

## 2020-11-12 DIAGNOSIS — M6281 Muscle weakness (generalized): Secondary | ICD-10-CM | POA: Diagnosis not present

## 2020-11-14 DIAGNOSIS — F0150 Vascular dementia without behavioral disturbance: Secondary | ICD-10-CM | POA: Diagnosis not present

## 2020-11-14 DIAGNOSIS — R2681 Unsteadiness on feet: Secondary | ICD-10-CM | POA: Diagnosis not present

## 2020-11-14 DIAGNOSIS — Z741 Need for assistance with personal care: Secondary | ICD-10-CM | POA: Diagnosis not present

## 2020-11-16 DIAGNOSIS — N189 Chronic kidney disease, unspecified: Secondary | ICD-10-CM | POA: Diagnosis not present

## 2020-11-16 DIAGNOSIS — D649 Anemia, unspecified: Secondary | ICD-10-CM | POA: Diagnosis not present

## 2020-11-16 DIAGNOSIS — F0150 Vascular dementia without behavioral disturbance: Secondary | ICD-10-CM | POA: Diagnosis not present

## 2020-11-16 DIAGNOSIS — N400 Enlarged prostate without lower urinary tract symptoms: Secondary | ICD-10-CM | POA: Diagnosis not present

## 2020-11-16 DIAGNOSIS — R2681 Unsteadiness on feet: Secondary | ICD-10-CM | POA: Diagnosis not present

## 2020-11-16 DIAGNOSIS — Z741 Need for assistance with personal care: Secondary | ICD-10-CM | POA: Diagnosis not present

## 2020-11-16 DIAGNOSIS — E785 Hyperlipidemia, unspecified: Secondary | ICD-10-CM | POA: Diagnosis not present

## 2020-11-16 DIAGNOSIS — K219 Gastro-esophageal reflux disease without esophagitis: Secondary | ICD-10-CM | POA: Diagnosis not present

## 2020-11-16 DIAGNOSIS — I10 Essential (primary) hypertension: Secondary | ICD-10-CM | POA: Diagnosis not present

## 2020-11-19 DIAGNOSIS — F03911 Unspecified dementia, unspecified severity, with agitation: Secondary | ICD-10-CM | POA: Diagnosis not present

## 2020-11-19 DIAGNOSIS — M6281 Muscle weakness (generalized): Secondary | ICD-10-CM | POA: Diagnosis not present

## 2020-11-20 DIAGNOSIS — G629 Polyneuropathy, unspecified: Secondary | ICD-10-CM | POA: Diagnosis not present

## 2020-11-20 DIAGNOSIS — R2681 Unsteadiness on feet: Secondary | ICD-10-CM | POA: Diagnosis not present

## 2020-11-20 DIAGNOSIS — F0150 Vascular dementia without behavioral disturbance: Secondary | ICD-10-CM | POA: Diagnosis not present

## 2020-11-20 DIAGNOSIS — L603 Nail dystrophy: Secondary | ICD-10-CM | POA: Diagnosis not present

## 2020-11-20 DIAGNOSIS — Z741 Need for assistance with personal care: Secondary | ICD-10-CM | POA: Diagnosis not present

## 2020-11-20 DIAGNOSIS — B351 Tinea unguium: Secondary | ICD-10-CM | POA: Diagnosis not present

## 2020-11-23 DIAGNOSIS — F0150 Vascular dementia without behavioral disturbance: Secondary | ICD-10-CM | POA: Diagnosis not present

## 2020-11-23 DIAGNOSIS — R2681 Unsteadiness on feet: Secondary | ICD-10-CM | POA: Diagnosis not present

## 2020-11-23 DIAGNOSIS — Z741 Need for assistance with personal care: Secondary | ICD-10-CM | POA: Diagnosis not present

## 2020-11-26 DIAGNOSIS — M6281 Muscle weakness (generalized): Secondary | ICD-10-CM | POA: Diagnosis not present

## 2020-11-26 DIAGNOSIS — F03911 Unspecified dementia, unspecified severity, with agitation: Secondary | ICD-10-CM | POA: Diagnosis not present
